# Patient Record
Sex: Female | Born: 2000 | Race: White | Hispanic: Yes | Marital: Single | Smoking: Never smoker
Health system: Southern US, Community
[De-identification: ages and names within clinical notes are randomized; demographics above are authoritative.]

## PROBLEM LIST (undated history)

## (undated) DIAGNOSIS — D509 Iron deficiency anemia, unspecified: Secondary | ICD-10-CM

## (undated) HISTORY — DX: Iron deficiency anemia, unspecified: D50.9

## (undated) HISTORY — PX: NO PAST SURGERIES: SHX2092

---
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
## 2019-01-25 IMAGING — CT CT RENAL STONE PROTOCOL
3 of 4 series · 8 of 46 positions shown, 15 images · non-contrast
Comparison: None.

CLINICAL DATA: Left flank pain for 4 days.  Vaginal bleeding.

EXAM:
CT ABDOMEN AND PELVIS WITHOUT CONTRAST
TECHNIQUE: Multidetector CT imaging of the abdomen and pelvis was performed
following the standard protocol without IV contrast.

[Series 4: lung bases · axial · 0.62mm/px · z∈[-108,-48]mm · 4 of 20 slices shown, 9 images]
[im 4/20  soft-tissue]
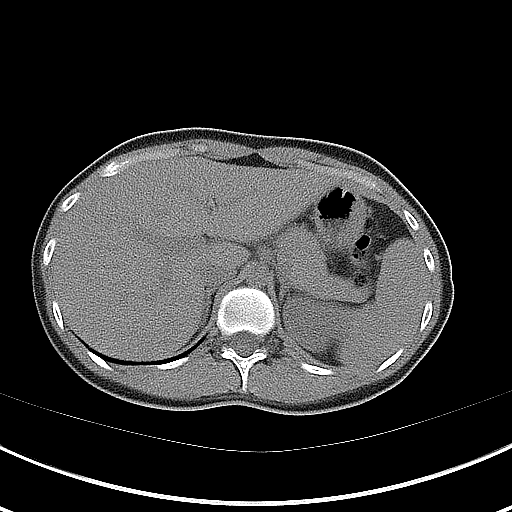
[im 4/20  lung]
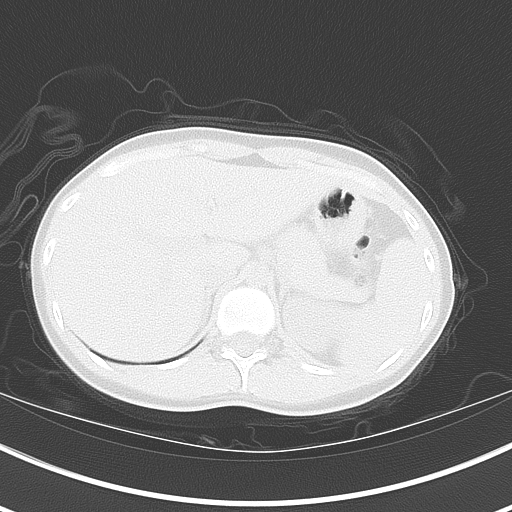
[im 4/20  bone]
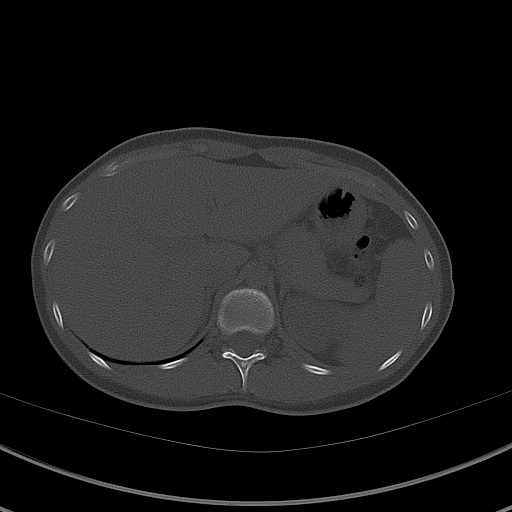
[im 8/20  soft-tissue]
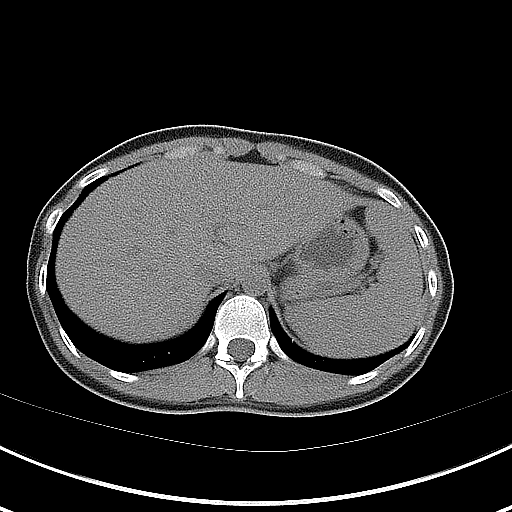
[im 8/20  lung]
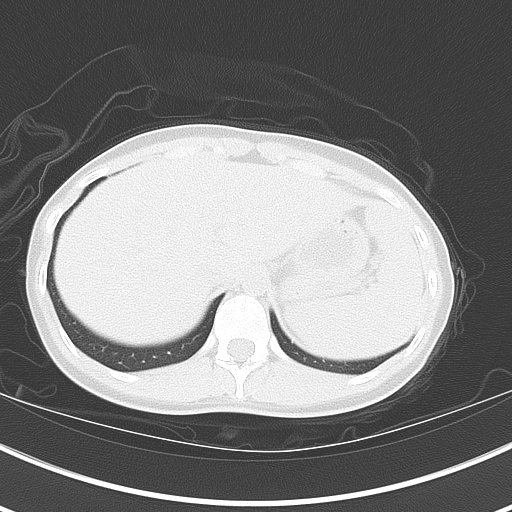
[im 12/20  soft-tissue]
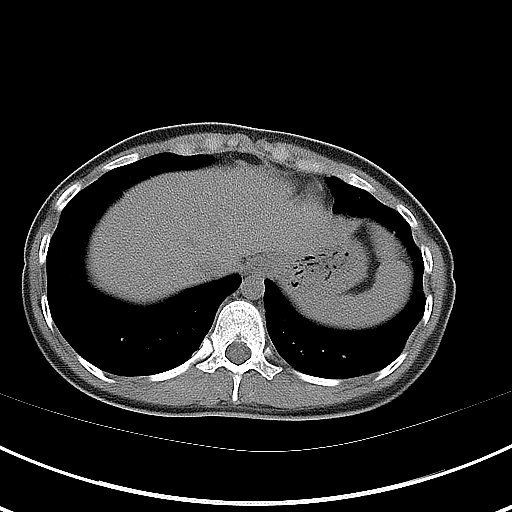
[im 12/20  lung]
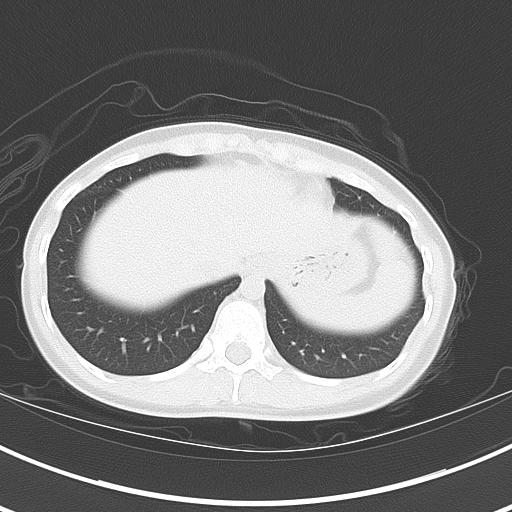
[im 16/20  soft-tissue]
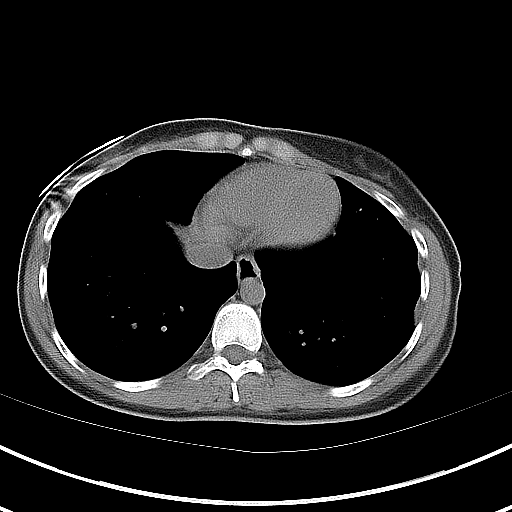
[im 16/20  lung]
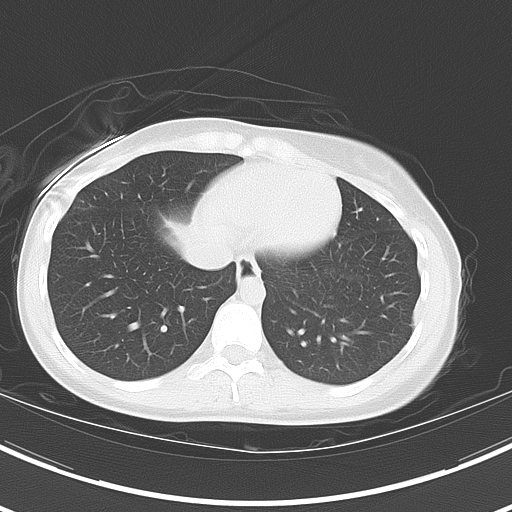

[Series 5: coronal · coronal · 0.62mm/px · 3 of 108 slices shown, 4 images]
[im 36/108  soft-tissue]
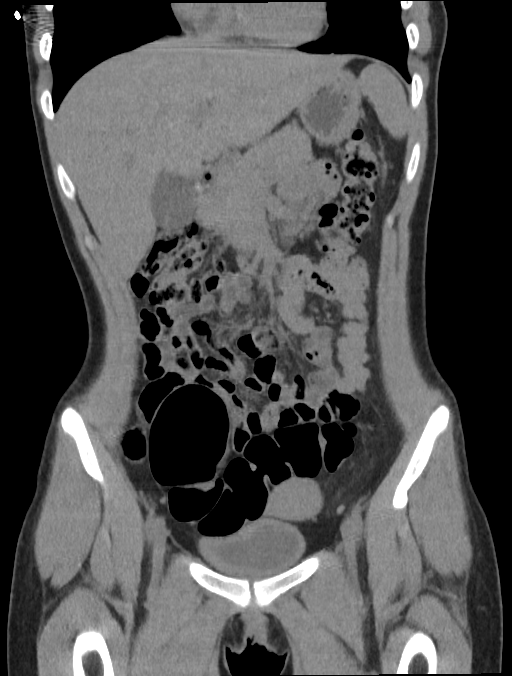
[im 48/108  soft-tissue]
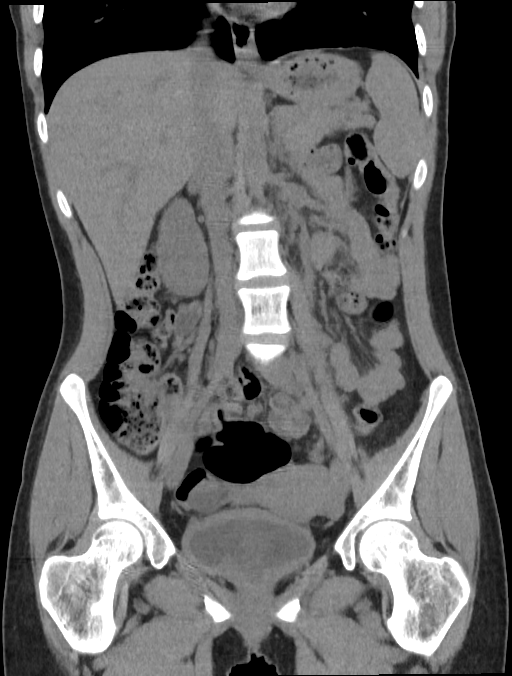
[im 48/108  bone]
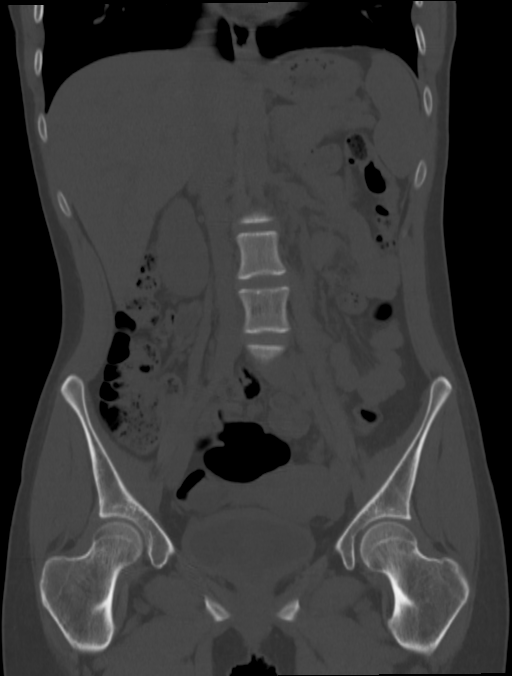
[im 60/108  soft-tissue]
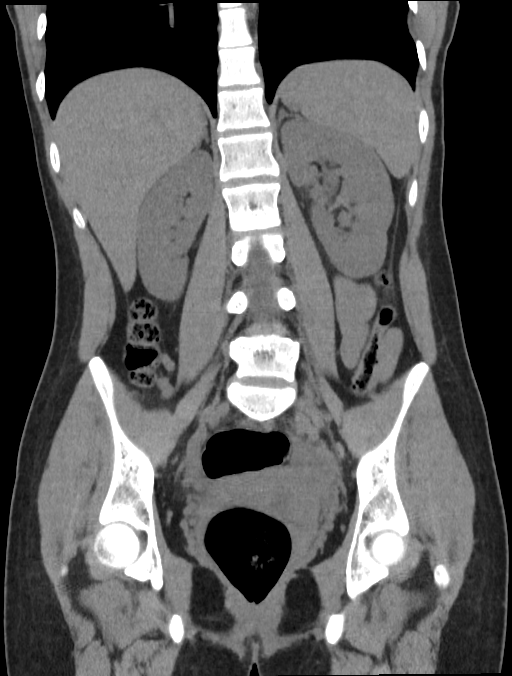

[Series 6: sagittal · sagittal · 0.42mm/px · 1 of 158 slices shown, 2 images]
[im 53/158  soft-tissue]
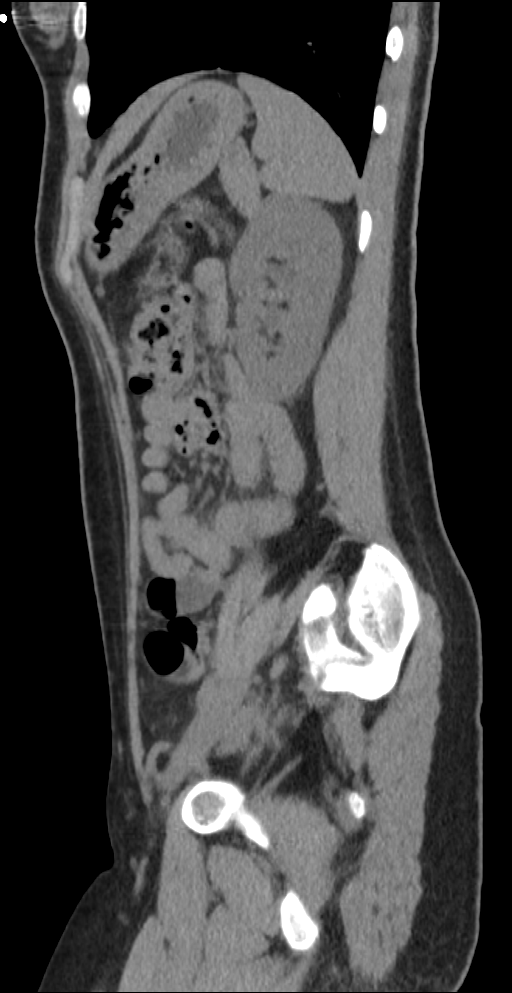
[im 53/158  bone]
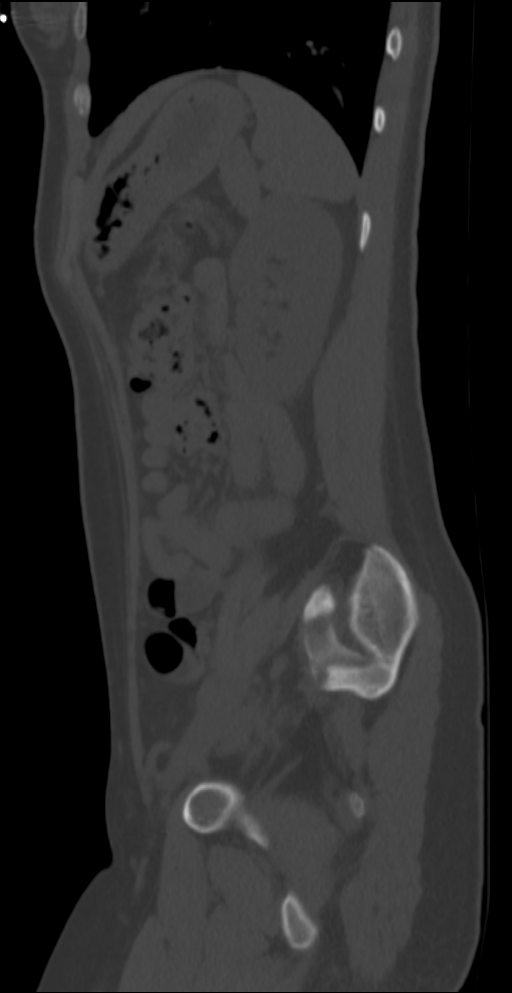

[8 of 46 positions shown; findings below may reference images not displayed]

FINDINGS: Lower chest: Clear lung bases. No significant pleural or pericardial
effusion.

Hepatobiliary: The liver appears unremarkable as imaged in the
noncontrast state. No evidence of gallstones, gallbladder wall
thickening or biliary dilatation.

Pancreas: Unremarkable. No pancreatic ductal dilatation or
surrounding inflammatory changes.

Spleen: Normal in size without focal abnormality.

Adrenals/Urinary Tract: Both adrenal glands appear normal. There is
mild dilatation of the left renal pelvis and left ureter with
minimal asymmetric perinephric soft tissue stranding on the left.
There is no evidence of renal, ureteral or bladder calculus. The
right kidney appears normal. No bladder abnormalities are seen.

Stomach/Bowel: No evidence of bowel wall thickening, distention or
surrounding inflammatory change. Retrocecal appendix appears normal.
There is prominent stool in the rectum.

Vascular/Lymphatic: There are no enlarged abdominal or pelvic lymph
nodes. No significant vascular findings on noncontrast imaging.

Reproductive: The uterus and ovaries appear normal.

Other: No evidence of abdominal wall mass or hernia. No ascites.

Musculoskeletal: No acute or significant osseous findings. Mild
convex left lumbar scoliosis.
IMPRESSION: 1. Mild left-sided hydroureter without evidence of ureteral or
bladder calculus. Findings could be secondary to a recently passed
ureteral calculus. Clinical follow-up recommended.
2. Prominent stool in the rectum without evidence of bowel
obstruction or acute process.
3. The uterus and adnexa appear unremarkable.

## 2019-02-28 ENCOUNTER — Other Ambulatory Visit: Payer: Self-pay

## 2019-02-28 DIAGNOSIS — Z20822 Contact with and (suspected) exposure to covid-19: Secondary | ICD-10-CM

## 2019-03-01 LAB — NOVEL CORONAVIRUS, NAA: SARS-CoV-2, NAA: NOT DETECTED

## 2019-03-29 ENCOUNTER — Encounter: Payer: Self-pay | Admitting: Oncology

## 2019-03-29 ENCOUNTER — Other Ambulatory Visit: Payer: Self-pay

## 2019-03-29 NOTE — Progress Notes (Signed)
Patient stated that she had been doing well with no complaints. 

## 2019-03-30 ENCOUNTER — Inpatient Hospital Stay (HOSPITAL_BASED_OUTPATIENT_CLINIC_OR_DEPARTMENT_OTHER): Payer: Self-pay | Admitting: Oncology

## 2019-03-30 ENCOUNTER — Other Ambulatory Visit: Payer: Self-pay

## 2019-03-30 ENCOUNTER — Encounter: Payer: Self-pay | Admitting: Oncology

## 2019-03-30 ENCOUNTER — Inpatient Hospital Stay: Payer: Self-pay | Attending: Oncology | Admitting: Oncology

## 2019-03-30 VITALS — BP 130/72 | HR 76 | Temp 97.9°F | Resp 16 | Wt 96.4 lb

## 2019-03-30 DIAGNOSIS — D500 Iron deficiency anemia secondary to blood loss (chronic): Secondary | ICD-10-CM

## 2019-03-30 DIAGNOSIS — Z3A00 Weeks of gestation of pregnancy not specified: Secondary | ICD-10-CM | POA: Insufficient documentation

## 2019-03-30 DIAGNOSIS — Z79899 Other long term (current) drug therapy: Secondary | ICD-10-CM | POA: Insufficient documentation

## 2019-03-30 DIAGNOSIS — O99011 Anemia complicating pregnancy, first trimester: Secondary | ICD-10-CM | POA: Insufficient documentation

## 2019-03-30 DIAGNOSIS — D509 Iron deficiency anemia, unspecified: Secondary | ICD-10-CM | POA: Insufficient documentation

## 2019-03-30 DIAGNOSIS — R0602 Shortness of breath: Secondary | ICD-10-CM | POA: Insufficient documentation

## 2019-03-30 DIAGNOSIS — R5383 Other fatigue: Secondary | ICD-10-CM | POA: Insufficient documentation

## 2019-03-30 LAB — CBC WITH DIFFERENTIAL/PLATELET
Abs Immature Granulocytes: 0 10*3/uL (ref 0.00–0.07)
Basophils Absolute: 0 10*3/uL (ref 0.0–0.1)
Basophils Relative: 1 %
Eosinophils Absolute: 0.3 10*3/uL (ref 0.0–0.5)
Eosinophils Relative: 5 %
HCT: 40.3 % (ref 36.0–46.0)
Hemoglobin: 13 g/dL (ref 12.0–15.0)
Immature Granulocytes: 0 %
Lymphocytes Relative: 34 %
Lymphs Abs: 2.1 10*3/uL (ref 0.7–4.0)
MCH: 28.8 pg (ref 26.0–34.0)
MCHC: 32.3 g/dL (ref 30.0–36.0)
MCV: 89.4 fL (ref 80.0–100.0)
Monocytes Absolute: 0.5 10*3/uL (ref 0.1–1.0)
Monocytes Relative: 8 %
Neutro Abs: 3.3 10*3/uL (ref 1.7–7.7)
Neutrophils Relative %: 52 %
Platelets: 221 10*3/uL (ref 150–400)
RBC: 4.51 MIL/uL (ref 3.87–5.11)
RDW: 12.5 % (ref 11.5–15.5)
WBC: 6.2 10*3/uL (ref 4.0–10.5)
nRBC: 0 % (ref 0.0–0.2)

## 2019-03-30 LAB — COMPREHENSIVE METABOLIC PANEL
ALT: 9 U/L (ref 0–44)
AST: 17 U/L (ref 15–41)
Albumin: 4.1 g/dL (ref 3.5–5.0)
Alkaline Phosphatase: 66 U/L (ref 38–126)
Anion gap: 8 (ref 5–15)
BUN: 14 mg/dL (ref 6–20)
CO2: 25 mmol/L (ref 22–32)
Calcium: 9.2 mg/dL (ref 8.9–10.3)
Chloride: 106 mmol/L (ref 98–111)
Creatinine, Ser: 0.65 mg/dL (ref 0.44–1.00)
GFR calc Af Amer: >60 mL/min (ref >60)
GFR calc non Af Amer: >60 mL/min (ref >60)
Glucose, Bld: 92 mg/dL (ref 70–99)
Potassium: 4.9 mmol/L (ref 3.5–5.1)
Sodium: 139 mmol/L (ref 135–145)
Total Bilirubin: 0.5 mg/dL (ref 0.3–1.2)
Total Protein: 7.6 g/dL (ref 6.5–8.1)

## 2019-03-30 LAB — IRON AND TIBC
Iron: 58 ug/dL (ref 28–170)
Saturation Ratios: 15 % (ref 10.4–31.8)
TIBC: 384 ug/dL (ref 250–450)
UIBC: 326 ug/dL

## 2019-03-30 LAB — FERRITIN: Ferritin: 9 ng/mL — ABNORMAL LOW (ref 11–307)

## 2019-03-30 NOTE — Progress Notes (Signed)
Patient was pre-assessed for MD visit. 

## 2019-04-02 MED ORDER — FERROUS SULFATE 325 (65 FE) MG PO TBEC: 325.0000 mg | DELAYED_RELEASE_TABLET | Freq: Two times a day (BID) | ORAL | 3 refills | Status: DC

## 2019-04-02 NOTE — Progress Notes (Signed)
Hematology/Oncology follow-up [facility] [phone] [phone]   Patient Care Team: Ander Slade, MD as PCP - General (Pediatrics)  REFERRING PROVIDER: Ander Slade, MD REASON FOR VISIT Follow up for iron deficiency anemia HISTORY OF PRESENTING ILLNESS:  Madison Nichols is a  18 y.o.  female with PMH listed below who was referred to me for evaluation of anemia. Patient recently had lab work done which revealed anemia with hemoglobin of 8.7 .  Ferritin 5 Reviewed patient's previous labs ordered by primary care physician's office, anemia duration is chronic, dated back to April 2019 at 9.1.  No aggravating or improving factors. She is currently pregnancy with first chid in third trimester and due on July 8th, 2019.  Associated symptoms: Patient reports fatigue. Fatigue: reports worsening fatigue. Chronic onset, perisistent, aggravated  with SOB with exertion, associated with ice chips craving.  Context: Denies weight loss, easy bruising, hematochezia, hemoptysis, hematuria.   Context: History of GI bleeding: Deneis               Menstrual bleeding/ Vaginal bleeding :                Hematemesis or hemoptysis : denies               Blood in urine : denies  Last endoscopy: None.  "Madison Nichols is a 18 y.o. female who has above history reviewed by me today presents for follow up visit for management of iron deficiency anemia. Patient reports doing well at baseline.  Takes ferrous sulfate 325 mg once daily. Denies any fatigue, Madison of breath, chest pain or abdominal pain No new complaints today.   Review of Systems  Constitutional: Negative for chills, fever, malaise/fatigue and weight loss.  HENT: Negative for sore throat.   Eyes: Negative for redness.  Respiratory: Negative for cough, Madison of breath and wheezing.   Cardiovascular: Negative for chest pain, palpitations and leg swelling.   Gastrointestinal: Negative for abdominal pain, blood in stool, nausea and vomiting.  Genitourinary: Negative for dysuria.  Musculoskeletal: Negative for myalgias.  Skin: Negative for rash.  Neurological: Negative for dizziness, tingling and tremors.  Endo/Heme/Allergies: Does not bruise/bleed easily.  Psychiatric/Behavioral: Negative for hallucinations.    MEDICAL HISTORY:  Past Medical History:  Diagnosis Date  . Iron deficiency anemia    No history anemia.  SURGICAL HISTORY: History reviewed. No pertinent surgical history. No past history of surgery  SOCIAL HISTORY: Social History   Socioeconomic History  . Marital status: Single    Spouse name: Not on file  . Number of children: Not on file  . Years of education: Not on file  . Highest education level: Not on file  Occupational History  . Not on file  Social Needs  . Financial resource strain: Not on file  . Food insecurity    Worry: Not on file    Inability: Not on file  . Transportation needs    Medical: Not on file    Non-medical: Not on file  Tobacco Use  . Smoking status: Never Smoker  . Smokeless tobacco: Never Used  Substance and Sexual Activity  . Alcohol use: Not Currently  . Drug use: Never  . Sexual activity: Not Currently  Lifestyle  . Physical activity    Days per week: Not on file    Minutes per session: Not on file  . Stress: Not on file  Relationships  . Social Herbalist on  phone: Not on file    Gets together: Not on file    Attends religious service: Not on file    Active member of club or organization: Not on file    Attends meetings of clubs or organizations: Not on file    Relationship status: Not on file  . Intimate partner violence    Fear of current or ex partner: Not on file    Emotionally abused: Not on file    Physically abused: Not on file    Forced sexual activity: Not on file  Other Topics Concern  . Not on file  Social History Narrative  . Not on file     FAMILY HISTORY: Family History  Family history unknown: Yes    ALLERGIES:  has No Known Allergies.  MEDICATIONS:  Current Outpatient Medications  Medication Sig Dispense Refill  . ferrous sulfate 325 (65 FE) MG EC tablet Take 1 tablet (325 mg total) by mouth 2 (two) times daily. 120 tablet 6  . TRI-LO-MARZIA 0.18/0.215/0.25 MG-25 MCG tab Take 1 tablet by mouth daily.  3  . sulfamethoxazole-trimethoprim (BACTRIM DS,SEPTRA DS) 800-160 MG tablet Take 1 tablet by mouth 2 (two) times daily.     No current facility-administered medications for this visit.      PHYSICAL EXAMINATION: ECOG PERFORMANCE STATUS: 0 - Asymptomatic Vitals:   03/30/19 0944  BP: 130/72  Pulse: 76  Resp: 16  Temp: 97.9 F (36.6 C)  SpO2: 100%   Filed Weights   03/30/19 0944  Weight: 96 lb 6.4 oz (43.7 kg)    Physical Exam Constitutional:      General: She is not in acute distress.    Appearance: She is well-developed.  HENT:     Head: Normocephalic and atraumatic.     Right Ear: External ear normal.     Left Ear: External ear normal.  Eyes:     General: No scleral icterus.    Conjunctiva/sclera: Conjunctivae normal.     Pupils: Pupils are equal, round, and reactive to light.  Neck:     Musculoskeletal: Normal range of motion and neck supple.  Cardiovascular:     Rate and Rhythm: Normal rate and regular rhythm.     Heart sounds: Normal heart sounds.  Pulmonary:     Effort: Pulmonary effort is normal. No respiratory distress.     Breath sounds: Normal breath sounds. No wheezing or rales.  Chest:     Chest wall: No tenderness.  Abdominal:     General: Bowel sounds are normal. There is no distension.     Palpations: Abdomen is soft. There is no mass.     Tenderness: There is no abdominal tenderness.  Musculoskeletal: Normal range of motion.        General: No deformity.  Lymphadenopathy:     Cervical: No cervical adenopathy.  Skin:    General: Skin is warm and dry.     Findings: No  erythema or rash.  Neurological:     Mental Status: She is alert and oriented to person, place, and time.     Cranial Nerves: No cranial nerve deficit.     Coordination: Coordination normal.  Psychiatric:        Behavior: Behavior normal.        Thought Content: Thought content normal.      LABORATORY DATA:  I have reviewed the data as listed Lab Results  Component Value Date   WBC 6.2 03/30/2019   HGB 13.0 03/30/2019   HCT  40.3 03/30/2019   MCV 89.4 03/30/2019   PLT 221 03/30/2019   Recent Labs    04/28/18 1136 03/30/19 0927  NA 137 139  K 4.0 4.9  CL 107 106  CO2 21* 25  GLUCOSE 89 92  BUN 8 14  CREATININE 0.65 0.65  CALCIUM 9.1 9.2  GFRNONAA >60 >60  GFRAA >60 >60  PROT 7.5 7.6  ALBUMIN 4.1 4.1  AST 15 17  ALT 9 9  ALKPHOS 47 66  BILITOT 0.5 0.5   Iron/TIBC/Ferritin/ %Sat    Component Value Date/Time   IRON 58 03/30/2019 0927   TIBC 384 03/30/2019 0927   FERRITIN 9 (L) 03/30/2019 0927   IRONPCTSAT 15 03/30/2019 0927        ASSESSMENT & PLAN:  1. Iron deficiency anemia due to chronic blood loss    Labs are reviewed. Hemoglobin stable at 13. Iron panel was available after clinic. Ferritin decreased to 9, iron saturation 15, TIBC 384. This is consistent with functional iron deficiency. Sent my chart message recommend patient to restart ferrous sulfate 325 mg twice daily. Recommend repeat blood work in 1 year.  Orders Placed This Encounter  Procedures  . CBC with Differential/Platelet    Standing Status:   Future    Standing Expiration Date:   04/01/2020  . Iron and TIBC    Standing Status:   Future    Standing Expiration Date:   04/01/2020  . Ferritin    Standing Status:   Future    Standing Expiration Date:   04/01/2020    All questions were answered. The patient knows to call the clinic with any problems questions or concerns.  Cc PCP Cc Dr. Feliberto GottronSchermerhorn.     Rickard PatienceZhou Earnestine Shipp, MD, PhD Hematology Oncology [facility]  Regional Pager- [phone] 04/02/2019

## 2019-04-02 NOTE — Progress Notes (Signed)
Please arrange patient to repeat blood work in 6 months, follow up with me 1-2 days, MD +/- Venofer.   Dear Madison Nichols,  You iron panel showed decreased ferritin, indicating lowe iron level. I recommend you to start taking oral ferrous sulfate 325mg  twice daily and our team will contact you to schedule you to repeat blood work in 6 months and follow up with me.   Thank you.   Dr.Uzair Godley

## 2019-06-30 ENCOUNTER — Ambulatory Visit: Payer: No Typology Code available for payment source | Admitting: Oncology

## 2019-06-30 ENCOUNTER — Other Ambulatory Visit: Payer: No Typology Code available for payment source

## 2019-08-20 ENCOUNTER — Encounter: Payer: Self-pay | Admitting: Emergency Medicine

## 2019-08-20 ENCOUNTER — Emergency Department
Admission: EM | Admit: 2019-08-20 | Discharge: 2019-08-20 | Disposition: A | Discharge status: Home / Self Care | Payer: Medicaid Other | Attending: Emergency Medicine | Admitting: Emergency Medicine

## 2019-08-20 ENCOUNTER — Other Ambulatory Visit: Payer: Self-pay

## 2019-08-20 DIAGNOSIS — Z79899 Other long term (current) drug therapy: Secondary | ICD-10-CM | POA: Insufficient documentation

## 2019-08-20 DIAGNOSIS — N10 Acute tubulo-interstitial nephritis: Secondary | ICD-10-CM | POA: Insufficient documentation

## 2019-08-20 LAB — CBC
HCT: 39.8 % (ref 36.0–46.0)
Hemoglobin: 13.4 g/dL (ref 12.0–15.0)
MCH: 29.6 pg (ref 26.0–34.0)
MCHC: 33.7 g/dL (ref 30.0–36.0)
MCV: 87.9 fL (ref 80.0–100.0)
Platelets: 240 10*3/uL (ref 150–400)
RBC: 4.53 MIL/uL (ref 3.87–5.11)
RDW: 12.6 % (ref 11.5–15.5)
WBC: 13.3 10*3/uL — ABNORMAL HIGH (ref 4.0–10.5)
nRBC: 0 % (ref 0.0–0.2)

## 2019-08-20 LAB — URINALYSIS, COMPLETE (UACMP) WITH MICROSCOPIC
Bilirubin Urine: NEGATIVE
Glucose, UA: NEGATIVE mg/dL
Hgb urine dipstick: NEGATIVE
Ketones, ur: 5 mg/dL — AB
Nitrite: NEGATIVE
Protein, ur: 30 mg/dL — AB
Specific Gravity, Urine: 1.019 (ref 1.005–1.030)
WBC, UA: >50 WBC/hpf — ABNORMAL HIGH (ref 0–5)
pH: 8 (ref 5.0–8.0)

## 2019-08-20 LAB — COMPREHENSIVE METABOLIC PANEL
ALT: 12 U/L (ref 0–44)
AST: 19 U/L (ref 15–41)
Albumin: 4.8 g/dL (ref 3.5–5.0)
Alkaline Phosphatase: 61 U/L (ref 38–126)
Anion gap: 10 (ref 5–15)
BUN: 10 mg/dL (ref 6–20)
CO2: 23 mmol/L (ref 22–32)
Calcium: 9.1 mg/dL (ref 8.9–10.3)
Chloride: 108 mmol/L (ref 98–111)
Creatinine, Ser: 0.6 mg/dL (ref 0.44–1.00)
GFR calc Af Amer: >60 mL/min (ref >60)
GFR calc non Af Amer: >60 mL/min (ref >60)
Glucose, Bld: 117 mg/dL — ABNORMAL HIGH (ref 70–99)
Potassium: 3.6 mmol/L (ref 3.5–5.1)
Sodium: 141 mmol/L (ref 135–145)
Total Bilirubin: 0.8 mg/dL (ref 0.3–1.2)
Total Protein: 7.9 g/dL (ref 6.5–8.1)

## 2019-08-20 LAB — LIPASE, BLOOD: Lipase: 20 U/L (ref 11–51)

## 2019-08-20 LAB — PREGNANCY, URINE: Preg Test, Ur: NEGATIVE

## 2019-08-20 MED ORDER — LEVOFLOXACIN 250 MG PO TABS: 500.0000 mg | ORAL_TABLET | Freq: Every day | ORAL | 0 refills | Status: AC

## 2019-08-20 MED ORDER — SODIUM CHLORIDE 0.9% FLUSH: 3.0000 mL | Freq: Once | INTRAVENOUS | Status: DC

## 2019-08-20 MED ORDER — SODIUM CHLORIDE 0.9 % IV BOLUS
1000.0000 mL | Freq: Once | INTRAVENOUS | Status: AC
  Administered 2019-08-20: 1000 mL via INTRAVENOUS

## 2019-08-20 MED ORDER — KETOROLAC TROMETHAMINE 30 MG/ML IJ SOLN
15.0000 mg | Freq: Once | INTRAMUSCULAR | Status: AC
  Administered 2019-08-20: 15 mg via INTRAVENOUS
  Filled 2019-08-20: qty 1

## 2019-08-20 MED ORDER — LEVOFLOXACIN IN D5W 500 MG/100ML IV SOLN
500.0000 mg | Freq: Once | INTRAVENOUS | Status: AC
  Administered 2019-08-20: 500 mg via INTRAVENOUS
  Filled 2019-08-20: qty 100

## 2019-08-20 MED ORDER — TRAMADOL HCL 50 MG PO TABS: 50.0000 mg | ORAL_TABLET | Freq: Four times a day (QID) | ORAL | 0 refills | Status: DC | PRN

## 2019-08-20 NOTE — ED Triage Notes (Signed)
Pt to ED via POV stating that she is having lower abdominal pain. Pt state that pain is so bad she can hardly walk. Pt was ambulatory to triage with steady gait and without difficulty or distress. Pt denies N/V/D. Pt denies fever, chills, or urinary symptoms. Pt is in NAD.

## 2019-08-20 NOTE — ED Notes (Signed)
Pt refused wheelchair, pt ambulating to lobby with s/o. Gait steady. Pt denies dizziness or weakness.

## 2019-08-20 NOTE — ED Provider Notes (Signed)
[facility] Emergency Department Provider Note  ____________________________________________  Time seen: Approximately 12:19 PM  I have reviewed the triage vital signs and the nursing notes.   HISTORY  Chief Complaint Abdominal Pain    HPI Madison Nichols is a 19 y.o. female presents to the emergency department for treatment and evaluation of lower abdominal pain that started this morning. She awoke with right side back pain that only hurts when she walks. Lower abdominal pain with urination. She denies vaginal bleeding or discharge.  Past Medical History:  Diagnosis Date  . Iron deficiency anemia     Patient Active Problem List   Diagnosis Date Noted  . Iron deficiency anemia 11/11/2017  . UTI (urinary tract infection) 06/10/2017  . Rubella non-immune status, antepartum 06/09/2017  . Exposure to [place] virus 06/07/2017    History reviewed. No pertinent surgical history.  Prior to Admission medications   Medication Sig Start Date End Date Taking? Authorizing Provider  ferrous sulfate 325 (65 FE) MG EC tablet Take 1 tablet (325 mg total) by mouth 2 (two) times daily. 04/02/19   Earlie Server, MD  levofloxacin (LEVAQUIN) 250 MG tablet Take 2 tablets (500 mg total) by mouth daily for 6 days. 08/21/19 08/27/19  Matther Labell, Johnette Abraham B, FNP  sulfamethoxazole-trimethoprim (BACTRIM DS,SEPTRA DS) 800-160 MG tablet Take 1 tablet by mouth 2 (two) times daily.    [provider]  traMADol (ULTRAM) 50 MG tablet Take 1 tablet (50 mg total) by mouth every 6 (six) hours as needed. 08/20/19   Jazelyn Sipe B, FNP  TRI-LO-MARZIA 0.18/0.215/0.25 MG-25 MCG tab Take 1 tablet by mouth daily. 03/23/18   [provider]    Allergies Patient has no known allergies.  Family History  Family history unknown: Yes    Social History Social History   Tobacco Use  . Smoking status: Never Smoker  . Smokeless tobacco: Never Used  Substance Use Topics  . Alcohol use: Not  Currently  . Drug use: Never    Review of Systems Constitutional: Negative for fever. Respiratory: Negative for shortness of breath or cough. Gastrointestinal: Positive for abdominal pain; negative for nausea , negative for vomiting. Genitourinary: Negative for dysuria , negative for vaginal discharge. Musculoskeletal: Positive for back pain. Skin: Negative for acute skin changes/rash/lesion. ____________________________________________   PHYSICAL EXAM:  VITAL SIGNS: ED Triage Vitals  Enc Vitals Group     BP 08/20/19 1024 109/68     Pulse Rate 08/20/19 1022 (!) 134     Resp 08/20/19 1022 16     Temp 08/20/19 1022 99.5 F (37.5 C)     Temp Source 08/20/19 1022 Oral     SpO2 08/20/19 1022 98 %     Weight 08/20/19 1022 97 lb (44 kg)     Height 08/20/19 1022 4\' 11"  (1.499 m)     Head Circumference --      Peak Flow --      Pain Score 08/20/19 1022 6     Pain Loc --      Pain Edu? --      Excl. in [place]? --     Constitutional: Alert and oriented. Well appearing and in no acute distress. Eyes: Conjunctivae are normal. Head: Atraumatic. Nose: No congestion/rhinnorhea. Mouth/Throat: Mucous membranes are moist. Respiratory: Normal respiratory effort.  No retractions. Gastrointestinal: Bowel sounds active x 4; Abdomen is soft without rebound or guarding. Suprapubic tenderness with palpation. Genitourinary: Pelvic exam: not indicated. Musculoskeletal: No extremity tenderness nor edema. Right side CVA tenderness.  Neurologic:  Normal speech and language. No gross focal neurologic deficits are appreciated. Speech is normal. No gait instability. Skin:  Skin is warm, dry and intact. No rash noted on exposed skin. Psychiatric: Mood and affect are normal. Speech and behavior are normal.  ____________________________________________   LABS (all labs ordered are listed, but only abnormal results are displayed)  Labs Reviewed  COMPREHENSIVE METABOLIC PANEL - Abnormal; Notable for the  following components:      Result Value   Glucose, Bld 117 (*)    All other components within normal limits  CBC - Abnormal; Notable for the following components:   WBC 13.3 (*)    All other components within normal limits  URINALYSIS, COMPLETE (UACMP) WITH MICROSCOPIC - Abnormal; Notable for the following components:   Color, Urine YELLOW (*)    APPearance HAZY (*)    Ketones, ur 5 (*)    Protein, ur 30 (*)    Leukocytes,Ua LARGE (*)    WBC, UA >50 (*)    Bacteria, UA FEW (*)    All other components within normal limits  LIPASE, BLOOD  PREGNANCY, URINE   ____________________________________________  RADIOLOGY  Not indicated. ____________________________________________  Procedures  ____________________________________________  19 year old female presents to the ER for treatment of abdominal pain. While awaiting ER room assignment, protocol labs were drawn. WBC mildly elevated at 13.3 but otherwise normal. Urinalysis shows concern for acute cystitis with large amount of leukocytes, over 50 WBC, and bacteria. On exam, she has suprapubic tenderness and right CVA tenderness. Plan will be to get pregnancy test, give fluids and antibiotics for likely early pyelonephritis then discharge home.  Differential diagnosis includes, but is not limited to: acute cystitis, pyelonephritis, PID, renal colic.  Pregnancy test is negative. Levaquin ordered.   Tachycardia resolved with fluids and rest. Her pain improved as well. She will be discharged home and is aware not to take antibiotic today. She is to follow up with primary care in about a week. ER return precautions also provided.  Pertinent labs & imaging results that were available during my care of the patient were reviewed by me and considered in my medical decision making (see chart for details).  ____________________________________________   FINAL CLINICAL IMPRESSION(S) / ED DIAGNOSES  Final diagnoses:  Pyelonephritis, acute     Note:  This document was prepared using Dragon voice recognition software and may include unintentional dictation errors.   Chinita Pester, FNP 08/20/19 1451    Emily Filbert, MD 08/20/19 [phone]

## 2019-08-20 NOTE — Discharge Instructions (Signed)
Please follow up with your primary care provider next week.  You do not need to take antibiotic today since you had it in your IV. You may take your pain medication as well as tylenol or ibuprofen.  If your symptoms change or get worse and you can't get an appointment with primary care, come back to the ER.

## 2019-12-26 ENCOUNTER — Other Ambulatory Visit: Payer: Self-pay | Admitting: Family Medicine

## 2019-12-26 ENCOUNTER — Other Ambulatory Visit (HOSPITAL_COMMUNITY): Payer: Self-pay | Admitting: Family Medicine

## 2019-12-26 DIAGNOSIS — Z3480 Encounter for supervision of other normal pregnancy, unspecified trimester: Secondary | ICD-10-CM

## 2020-01-03 ENCOUNTER — Other Ambulatory Visit: Payer: Self-pay | Admitting: Family Medicine

## 2020-01-03 DIAGNOSIS — Z3491 Encounter for supervision of normal pregnancy, unspecified, first trimester: Secondary | ICD-10-CM

## 2020-01-03 DIAGNOSIS — Z3480 Encounter for supervision of other normal pregnancy, unspecified trimester: Secondary | ICD-10-CM

## 2020-01-03 DIAGNOSIS — Z3687 Encounter for antenatal screening for uncertain dates: Secondary | ICD-10-CM

## 2020-01-04 ENCOUNTER — Ambulatory Visit
Admission: RE | Admit: 2020-01-04 | Discharge: 2020-01-04 | Disposition: A | Discharge status: Home / Self Care | Payer: Medicaid Other | Source: Ambulatory Visit | Attending: Family Medicine | Admitting: Family Medicine

## 2020-01-04 ENCOUNTER — Other Ambulatory Visit: Payer: Self-pay

## 2020-01-04 DIAGNOSIS — Z3687 Encounter for antenatal screening for uncertain dates: Secondary | ICD-10-CM | POA: Insufficient documentation

## 2020-01-04 DIAGNOSIS — Z3491 Encounter for supervision of normal pregnancy, unspecified, first trimester: Secondary | ICD-10-CM | POA: Insufficient documentation

## 2020-01-04 DIAGNOSIS — Z3480 Encounter for supervision of other normal pregnancy, unspecified trimester: Secondary | ICD-10-CM | POA: Diagnosis not present

## 2020-02-14 LAB — OB RESULTS CONSOLE ABO/RH: RH Type: POSITIVE

## 2020-02-14 LAB — OB RESULTS CONSOLE ANTIBODY SCREEN: Antibody Screen: NEGATIVE

## 2020-02-14 LAB — OB RESULTS CONSOLE HEPATITIS B SURFACE ANTIGEN: Hepatitis B Surface Ag: NEGATIVE

## 2020-02-14 LAB — OB RESULTS CONSOLE VARICELLA ZOSTER ANTIBODY, IGG: Varicella: IMMUNE

## 2020-02-14 LAB — OB RESULTS CONSOLE RUBELLA ANTIBODY, IGM: Rubella: IMMUNE

## 2020-03-27 ENCOUNTER — Other Ambulatory Visit: Payer: Self-pay | Admitting: Family Medicine

## 2020-03-27 DIAGNOSIS — Z3482 Encounter for supervision of other normal pregnancy, second trimester: Secondary | ICD-10-CM

## 2020-04-09 ENCOUNTER — Encounter: Payer: Self-pay | Admitting: *Deleted

## 2020-04-09 ENCOUNTER — Other Ambulatory Visit: Payer: Self-pay

## 2020-04-09 ENCOUNTER — Emergency Department
Admission: EM | Admit: 2020-04-09 | Discharge: 2020-04-09 | Disposition: A | Discharge status: Home / Self Care | Payer: Medicaid Other | Attending: Emergency Medicine | Admitting: Emergency Medicine

## 2020-04-09 DIAGNOSIS — O2342 Unspecified infection of urinary tract in pregnancy, second trimester: Secondary | ICD-10-CM | POA: Insufficient documentation

## 2020-04-09 DIAGNOSIS — O26892 Other specified pregnancy related conditions, second trimester: Secondary | ICD-10-CM | POA: Diagnosis present

## 2020-04-09 DIAGNOSIS — N3000 Acute cystitis without hematuria: Secondary | ICD-10-CM | POA: Insufficient documentation

## 2020-04-09 DIAGNOSIS — Z3A21 21 weeks gestation of pregnancy: Secondary | ICD-10-CM | POA: Diagnosis not present

## 2020-04-09 DIAGNOSIS — O2312 Infections of bladder in pregnancy, second trimester: Secondary | ICD-10-CM

## 2020-04-09 LAB — URINALYSIS, COMPLETE (UACMP) WITH MICROSCOPIC
Bilirubin Urine: NEGATIVE
Glucose, UA: NEGATIVE mg/dL
Ketones, ur: 5 mg/dL — AB
Nitrite: POSITIVE — AB
Protein, ur: 100 mg/dL — AB
Specific Gravity, Urine: 1.018 (ref 1.005–1.030)
Squamous Epithelial / HPF: NONE SEEN (ref 0–5)
WBC, UA: >50 WBC/hpf — ABNORMAL HIGH (ref 0–5)
pH: 6 (ref 5.0–8.0)

## 2020-04-09 MED ORDER — CEPHALEXIN 500 MG PO CAPS: 500.0000 mg | ORAL_CAPSULE | Freq: Three times a day (TID) | ORAL | 0 refills | Status: AC

## 2020-04-09 MED ORDER — CEFTRIAXONE SODIUM 1 G IJ SOLR
1.0000 g | Freq: Once | INTRAMUSCULAR | Status: AC
  Administered 2020-04-09: 1 g via INTRAMUSCULAR
  Filled 2020-04-09: qty 10

## 2020-04-09 MED ORDER — LIDOCAINE HCL (PF) 1 % IJ SOLN
1.2000 mL | Freq: Once | INTRAMUSCULAR | Status: AC
  Administered 2020-04-09: 1.2 mL
  Filled 2020-04-09: qty 5

## 2020-04-09 NOTE — ED Notes (Signed)
follow  up pcp info provided all questions answered , all rx info provided

## 2020-04-09 NOTE — ED Provider Notes (Signed)
[facility] Emergency Department Provider Note ____________________________________________   First MD Initiated Contact with Patient 04/09/20 1650     (approximate)  I have reviewed the triage vital signs and the nursing notes.   HISTORY  Chief Complaint Back Pain  HPI Madison Nichols is a 19 y.o. female [redacted] weeks pregnant presents to the emergency department for treatment and evaluation of left-sided back pain.  Symptoms began this morning.  She has some burning at the end of urination.  She denies nausea, vomiting, diarrhea.  She denies vaginal bleeding or discharge.  She has not taken any medications prior to arrival..         Past Medical History:  Diagnosis Date  . Iron deficiency anemia     Patient Active Problem List   Diagnosis Date Noted  . Iron deficiency anemia 11/11/2017  . UTI (urinary tract infection) 06/10/2017  . Rubella non-immune status, antepartum 06/09/2017  . Exposure to [place] virus 06/07/2017    No past surgical history on file.  Prior to Admission medications   Medication Sig Start Date End Date Taking? Authorizing Provider  cephALEXin (KEFLEX) 500 MG capsule Take 1 capsule (500 mg total) by mouth 3 (three) times daily for 10 days. 04/09/20 04/19/20  Ulani Degrasse, Rulon Eisenmenger B, FNP  ferrous sulfate 325 (65 FE) MG EC tablet Take 1 tablet (325 mg total) by mouth 2 (two) times daily. 04/02/19   Rickard Patience, MD  sulfamethoxazole-trimethoprim (BACTRIM DS,SEPTRA DS) 800-160 MG tablet Take 1 tablet by mouth 2 (two) times daily.    [provider]  traMADol (ULTRAM) 50 MG tablet Take 1 tablet (50 mg total) by mouth every 6 (six) hours as needed. 08/20/19   Dashiel Bergquist B, FNP  TRI-LO-MARZIA 0.18/0.215/0.25 MG-25 MCG tab Take 1 tablet by mouth daily. 03/23/18   [provider]    Allergies Patient has no known allergies.  Family History  Family history unknown: Yes    Social History Social History   Tobacco Use  . Smoking  status: Never Smoker  . Smokeless tobacco: Never Used  Vaping Use  . Vaping Use: Never used  Substance Use Topics  . Alcohol use: Not Currently  . Drug use: Never    Review of Systems  Constitutional: No fever/chills Eyes: No visual changes. ENT: No sore throat. Cardiovascular: Denies chest pain. Respiratory: Denies shortness of breath. Gastrointestinal: No abdominal pain.  No nausea, no vomiting.  No diarrhea.  No constipation. Genitourinary: Negative for dysuria. Musculoskeletal: Positive for back pain. Skin: Negative for rash. Neurological: Negative for headaches, focal weakness or numbness. ____________________________________________   PHYSICAL EXAM:  VITAL SIGNS: ED Triage Vitals  Enc Vitals Group     BP 04/09/20 1559 (!) 105/50     Pulse Rate 04/09/20 1559 90     Resp 04/09/20 1559 18     Temp 04/09/20 1559 99.2 F (37.3 C)     Temp Source 04/09/20 1559 Oral     SpO2 04/09/20 1559 100 %     Weight 04/09/20 1546 99 lb (44.9 kg)     Height 04/09/20 1546 4\' 11"  (1.499 m)     Head Circumference --      Peak Flow --      Pain Score 04/09/20 1546 7     Pain Loc --      Pain Edu? --      Excl. in [place]? --     Constitutional: Alert and oriented. Well appearing and in no acute distress. Eyes:  Conjunctivae are normal. Head: Atraumatic. Nose: No congestion/rhinnorhea. Mouth/Throat: Mucous membranes are moist.  Oropharynx non-erythematous. Neck: No stridor.   Hematological/Lymphatic/Immunilogical: No cervical lymphadenopathy. Cardiovascular: Normal rate, regular rhythm. Grossly normal heart sounds.  Good peripheral circulation. Respiratory: Normal respiratory effort.  No retractions. Lungs CTAB. Gastrointestinal: Soft with mild tenderness overlying the suprapubic area. No distention. No abdominal bruits. No CVA tenderness. Genitourinary:  Musculoskeletal: No lower extremity tenderness nor edema.  No joint effusions. Neurologic:  Normal speech and language. No gross  focal neurologic deficits are appreciated. No gait instability. Skin:  Skin is warm, dry and intact. No rash noted. Psychiatric: Mood and affect are normal. Speech and behavior are normal.  ____________________________________________   LABS (all labs ordered are listed, but only abnormal results are displayed)  Labs Reviewed  URINE CULTURE - Abnormal; Notable for the following components:      Result Value   Culture >=100,000 COLONIES/mL ESCHERICHIA COLI (*)    Organism ID, Bacteria ESCHERICHIA COLI (*)    All other components within normal limits  URINALYSIS, COMPLETE (UACMP) WITH MICROSCOPIC - Abnormal; Notable for the following components:   Color, Urine YELLOW (*)    APPearance CLOUDY (*)    Hgb urine dipstick MODERATE (*)    Ketones, ur 5 (*)    Protein, ur 100 (*)    Nitrite POSITIVE (*)    Leukocytes,Ua LARGE (*)    WBC, UA >50 (*)    Bacteria, UA RARE (*)    All other components within normal limits   ____________________________________________  EKG  Not indicated ____________________________________________  RADIOLOGY  ED MD interpretation:    Not indicated I, Suhas Estis, personally viewed and evaluated these images (plain radiographs) as part of my medical decision making, as well as reviewing the written report by the radiologist.  Official radiology report(s): No results found.  ____________________________________________   PROCEDURES  Procedure(s) performed (including Critical Care):  Procedures  ____________________________________________   INITIAL IMPRESSION / ASSESSMENT AND PLAN     19 year old female presenting to emergency department for treatment and evaluation of flank pain.  See HPI for further details.  Exam is overall reassuring.  While awaiting ER room assignment, urinalysis was collected which is concerning for acute cystitis versus early pyelonephritis.  Patient will be treated with Keflex and advised to follow-up with her  OB/GYN for repeat UA in about 10 days.  If she begins developing any abdominal cramping, vaginal bleeding, or vaginal discharge she is to contact her OB/GYN or return to the emergency department.   ___________________________________________   FINAL CLINICAL IMPRESSION(S) / ED DIAGNOSES  Final diagnoses:  Acute cystitis during pregnancy in second trimester     ED Discharge Orders         Ordered    cephALEXin (KEFLEX) 500 MG capsule  3 times daily        04/09/20 1712           Ludene Stokke was evaluated in Emergency Department on 04/14/2020 for the symptoms described in the history of present illness. She was evaluated in the context of the global COVID-19 pandemic, which necessitated consideration that the patient might be at risk for infection with the SARS-CoV-2 virus that causes COVID-19. Institutional protocols and algorithms that pertain to the evaluation of patients at risk for COVID-19 are in a state of rapid change based on information released by regulatory bodies including the CDC and federal and state organizations. These policies and algorithms were followed during the patient's care in the ED.  Note:  This document was prepared using Dragon voice recognition software and may include unintentional dictation errors.    Chinita Pester, FNP 04/14/20 1147    Shaune Pollack, MD 04/15/20 [phone]

## 2020-04-09 NOTE — ED Notes (Signed)
Room computer was malfunctioning and had to document meds seperately.

## 2020-04-09 NOTE — ED Triage Notes (Addendum)
Pt states she has left side back pain.  Sx began this am.  No urinary sx.  No n/v/d  No abd pain.  No vag bleeding.   Pt is [redacted] weeks pregnant  Pt alert.  No otc meds for back pain.

## 2020-04-12 ENCOUNTER — Other Ambulatory Visit: Payer: Self-pay

## 2020-04-12 ENCOUNTER — Ambulatory Visit
Admission: RE | Admit: 2020-04-12 | Discharge: 2020-04-12 | Disposition: A | Discharge status: Home / Self Care | Payer: Medicaid Other | Source: Ambulatory Visit | Attending: Family Medicine | Admitting: Family Medicine

## 2020-04-12 DIAGNOSIS — Z3482 Encounter for supervision of other normal pregnancy, second trimester: Secondary | ICD-10-CM | POA: Diagnosis not present

## 2020-04-12 LAB — URINE CULTURE: Culture: >=100000 — AB

## 2020-05-29 LAB — OB RESULTS CONSOLE HIV ANTIBODY (ROUTINE TESTING): HIV: NONREACTIVE

## 2020-05-31 LAB — OB RESULTS CONSOLE RPR: RPR: NONREACTIVE

## 2020-06-01 NOTE — L&D Delivery Note (Addendum)
Delivery Note  Date of delivery: 08/11/2020 Estimated Date of Delivery: 08/15/20 Patient's last menstrual period was 10/11/2019 (approximate). EGA: [redacted]w[redacted]d  Delivery Note At 3:21 PM a viable female was delivered via Vaginal, Spontaneous (Presentation: Left Occiput Anterior).  APGAR: 9, 9; weight pending.  Placenta status: Spontaneous, Intact.  Cord: 3 vessels with the following complications: None.    First Stage: Labor onset: 1000 Augmentation : None Analgesia /Anesthesia intrapartum: None AROM at 1510  Madison Nichols presented to L&D with active labor.  Second Stage: Complete dilation at 1512 Onset of pushing at 1512 FHR second stage Cat II Delivery at 1521 on 08/11/2020  She progressed to complete and had a spontaneous vaginal birth of a live female over an intact perineum. The fetal head was delivered in OA position with restitution to LOA. Loose nuchal cord easily reduced. Anterior then posterior shoulders delivered spontaneously. Baby placed on mom's abdomen and attended to by transition RN. Cord clamped and cut when pulseless by FOB. Cord blood obtained for newborn labs.  Third Stage: Placenta delivered intact with 3VC at 1526 Placenta disposition: routine disposal Uterine tone firm / bleeding min IV pitocin given for hemorrhage prophylaxis  Anesthesia: None Episiotomy: None Lacerations: Labial Suture Repair: 3.0 vicryl Est. Blood Loss (mL):  150  Complications: none  Mom to postpartum.  Baby to Couplet care / Skin to Skin.  Newborn: Birth Weight: pending  Apgar Scores: 9, 9 Feeding planned: bottlefeeding    [name], CNM 08/11/2020 3:38 PM

## 2020-07-12 ENCOUNTER — Other Ambulatory Visit: Payer: Self-pay

## 2020-07-12 DIAGNOSIS — D500 Iron deficiency anemia secondary to blood loss (chronic): Secondary | ICD-10-CM

## 2020-07-24 LAB — OB RESULTS CONSOLE GBS: GBS: NEGATIVE

## 2020-08-01 LAB — OB RESULTS CONSOLE GC/CHLAMYDIA
Chlamydia: NEGATIVE
Gonorrhea: NEGATIVE

## 2020-08-05 ENCOUNTER — Inpatient Hospital Stay: Payer: Medicaid Other | Attending: Oncology

## 2020-08-05 DIAGNOSIS — Z3A38 38 weeks gestation of pregnancy: Secondary | ICD-10-CM | POA: Diagnosis not present

## 2020-08-05 DIAGNOSIS — O26893 Other specified pregnancy related conditions, third trimester: Secondary | ICD-10-CM | POA: Insufficient documentation

## 2020-08-05 DIAGNOSIS — Z79899 Other long term (current) drug therapy: Secondary | ICD-10-CM | POA: Insufficient documentation

## 2020-08-05 DIAGNOSIS — D500 Iron deficiency anemia secondary to blood loss (chronic): Secondary | ICD-10-CM

## 2020-08-05 DIAGNOSIS — D508 Other iron deficiency anemias: Secondary | ICD-10-CM | POA: Insufficient documentation

## 2020-08-05 DIAGNOSIS — R5383 Other fatigue: Secondary | ICD-10-CM | POA: Diagnosis not present

## 2020-08-05 DIAGNOSIS — R0602 Shortness of breath: Secondary | ICD-10-CM | POA: Diagnosis not present

## 2020-08-05 LAB — FERRITIN: Ferritin: 8 ng/mL — ABNORMAL LOW (ref 11–307)

## 2020-08-05 LAB — COMPREHENSIVE METABOLIC PANEL
ALT: 5 U/L (ref 0–44)
AST: 17 U/L (ref 15–41)
Albumin: 2.9 g/dL — ABNORMAL LOW (ref 3.5–5.0)
Alkaline Phosphatase: 140 U/L — ABNORMAL HIGH (ref 38–126)
Anion gap: 11 (ref 5–15)
BUN: 10 mg/dL (ref 6–20)
CO2: 20 mmol/L — ABNORMAL LOW (ref 22–32)
Calcium: 8.4 mg/dL — ABNORMAL LOW (ref 8.9–10.3)
Chloride: 105 mmol/L (ref 98–111)
Creatinine, Ser: 0.58 mg/dL (ref 0.44–1.00)
GFR, Estimated: >60 mL/min (ref >60)
Glucose, Bld: 139 mg/dL — ABNORMAL HIGH (ref 70–99)
Potassium: 4 mmol/L (ref 3.5–5.1)
Sodium: 136 mmol/L (ref 135–145)
Total Bilirubin: 0.5 mg/dL (ref 0.3–1.2)
Total Protein: 6.5 g/dL (ref 6.5–8.1)

## 2020-08-05 LAB — CBC WITH DIFFERENTIAL/PLATELET
Abs Immature Granulocytes: 0.02 10*3/uL (ref 0.00–0.07)
Basophils Absolute: 0 10*3/uL (ref 0.0–0.1)
Basophils Relative: 0 %
Eosinophils Absolute: 0.1 10*3/uL (ref 0.0–0.5)
Eosinophils Relative: 1 %
HCT: 26.6 % — ABNORMAL LOW (ref 36.0–46.0)
Hemoglobin: 7.8 g/dL — ABNORMAL LOW (ref 12.0–15.0)
Immature Granulocytes: 0 %
Lymphocytes Relative: 28 %
Lymphs Abs: 1.6 10*3/uL (ref 0.7–4.0)
MCH: 22.5 pg — ABNORMAL LOW (ref 26.0–34.0)
MCHC: 29.3 g/dL — ABNORMAL LOW (ref 30.0–36.0)
MCV: 76.9 fL — ABNORMAL LOW (ref 80.0–100.0)
Monocytes Absolute: 0.2 10*3/uL (ref 0.1–1.0)
Monocytes Relative: 4 %
Neutro Abs: 3.8 10*3/uL (ref 1.7–7.7)
Neutrophils Relative %: 67 %
Platelets: 212 10*3/uL (ref 150–400)
RBC: 3.46 MIL/uL — ABNORMAL LOW (ref 3.87–5.11)
RDW: 18.3 % — ABNORMAL HIGH (ref 11.5–15.5)
WBC: 5.8 10*3/uL (ref 4.0–10.5)
nRBC: 0 % (ref 0.0–0.2)

## 2020-08-05 LAB — IRON AND TIBC
Iron: 25 ug/dL — ABNORMAL LOW (ref 28–170)
Saturation Ratios: 4 % — ABNORMAL LOW (ref 10.4–31.8)
TIBC: 606 ug/dL — ABNORMAL HIGH (ref 250–450)
UIBC: 581 ug/dL

## 2020-08-06 ENCOUNTER — Encounter: Payer: Self-pay | Admitting: Oncology

## 2020-08-06 ENCOUNTER — Other Ambulatory Visit: Payer: Self-pay

## 2020-08-06 ENCOUNTER — Inpatient Hospital Stay: Payer: Medicaid Other

## 2020-08-06 ENCOUNTER — Inpatient Hospital Stay (HOSPITAL_BASED_OUTPATIENT_CLINIC_OR_DEPARTMENT_OTHER): Payer: Medicaid Other | Admitting: Oncology

## 2020-08-06 VITALS — BP 111/66 | HR 93 | Resp 18

## 2020-08-06 VITALS — BP 116/61 | HR 96 | Temp 97.9°F | Resp 18 | Wt 120.5 lb

## 2020-08-06 DIAGNOSIS — Z3493 Encounter for supervision of normal pregnancy, unspecified, third trimester: Secondary | ICD-10-CM

## 2020-08-06 DIAGNOSIS — D508 Other iron deficiency anemias: Secondary | ICD-10-CM

## 2020-08-06 MED ORDER — IRON SUCROSE 20 MG/ML IV SOLN
200.0000 mg | Freq: Once | INTRAVENOUS | Status: AC
  Administered 2020-08-06: 200 mg via INTRAVENOUS
  Filled 2020-08-06: qty 10

## 2020-08-06 MED ORDER — SODIUM CHLORIDE 0.9 % IV SOLN
Freq: Once | INTRAVENOUS | Status: AC
  Filled 2020-08-06: qty 250

## 2020-08-06 NOTE — Progress Notes (Signed)
Patient here for follow up. Pt is [redacted] weeks pregnant.

## 2020-08-06 NOTE — Progress Notes (Signed)
Hematology/Oncology follow-up [facility] [phone] [phone]   Patient Care Team: Center, [facility] as PCP - General (General Practice) Rickard Patience, MD as Consulting Physician (Hematology and Oncology)  REFERRING PROVIDER: Center, [facility] REASON FOR VISIT Follow up for iron deficiency anemia HISTORY OF PRESENTING ILLNESS:  Madison Nichols is a  20 y.o.  female with PMH listed below who was referred to me for evaluation of anemia. Patient recently had lab work done which revealed anemia with hemoglobin of 8.7 .  Ferritin 5 Reviewed patient's previous labs ordered by primary care physician's office, anemia duration is chronic, dated back to April 2019 at 9.1.  No aggravating or improving factors. She is currently pregnancy with first chid in third trimester and due on July 8th, 2019.  Associated symptoms: Patient reports fatigue. Fatigue: reports worsening fatigue. Chronic onset, perisistent, aggravated  with SOB with exertion, associated with ice chips craving.  Context: Denies weight loss, easy bruising, hematochezia, hemoptysis, hematuria.   Context: History of GI bleeding: Deneis               Menstrual bleeding/ Vaginal bleeding :                Hematemesis or hemoptysis : denies               Blood in urine : denies  Last endoscopy: None.  "Shortness of INTERVAL HISTORY Madison Nichols is a 20 y.o. female who has above history reviewed by me today presents for follow up visit for management of iron deficiency anemia. Patient reports doing well at baseline.  Patient was last seen by me 03/30/2019.  Lost follow-up afterwards. Recently being referred back to see [place] for iron deficiency anemia. Currently she is pregnant with her second child, 38 weeks.  Due date is August 15, 2020 Patient reports her current pregnancy course is uncomplicated. She feels tired She previously tolerated Venofer treatments  very well.  Review of Systems  Constitutional: Positive for malaise/fatigue. Negative for chills, fever and weight loss.  HENT: Negative for sore throat.   Eyes: Negative for redness.  Respiratory: Negative for cough, shortness of breath and wheezing.   Cardiovascular: Negative for chest pain, palpitations and leg swelling.  Gastrointestinal: Negative for abdominal pain, blood in stool, nausea and vomiting.  Genitourinary: Negative for dysuria.  Musculoskeletal: Negative for myalgias.  Skin: Negative for rash.  Neurological: Negative for dizziness, tingling and tremors.  Endo/Heme/Allergies: Does not bruise/bleed easily.  Psychiatric/Behavioral: Negative for hallucinations.    MEDICAL HISTORY:  Past Medical History:  Diagnosis Date  . Iron deficiency anemia    No history anemia.  SURGICAL HISTORY: No past surgical history on file. No past history of surgery  SOCIAL HISTORY: Social History   Socioeconomic History  . Marital status: Single    Spouse name: Not on file  . Number of children: Not on file  . Years of education: Not on file  . Highest education level: Not on file  Occupational History  . Not on file  Tobacco Use  . Smoking status: Never Smoker  . Smokeless tobacco: Never Used  Vaping Use  . Vaping Use: Never used  Substance and Sexual Activity  . Alcohol use: Not Currently  . Drug use: Never  . Sexual activity: Not Currently  Other Topics Concern  . Not on file  Social History Narrative  . Not on file   Social Determinants of Health   Financial Resource Strain:  Not on file  Food Insecurity: Not on file  Transportation Needs: Not on file  Physical Activity: Not on file  Stress: Not on file  Social Connections: Not on file  Intimate Partner Violence: Not on file    FAMILY HISTORY: Family History  Family history unknown: Yes    ALLERGIES:  has No Known Allergies.  MEDICATIONS:  Current Outpatient Medications  Medication Sig Dispense  Refill  . ferrous sulfate 325 (65 FE) MG EC tablet Take 1 tablet (325 mg total) by mouth 2 (two) times daily. 120 tablet 3  . sulfamethoxazole-trimethoprim (BACTRIM DS,SEPTRA DS) 800-160 MG tablet Take 1 tablet by mouth 2 (two) times daily.    . traMADol (ULTRAM) 50 MG tablet Take 1 tablet (50 mg total) by mouth every 6 (six) hours as needed. 12 tablet 0  . famotidine (PEPCID) 20 MG tablet TAKE 1 TABLET BY MOUTH TWICE A DAY AS NEEDED FOR HEARTBURN    . TRI-LO-MARZIA 0.18/0.215/0.25 MG-25 MCG tab Take 1 tablet by mouth daily. (Patient not taking: Reported on 08/06/2020)  3   No current facility-administered medications for this visit.     PHYSICAL EXAMINATION: ECOG PERFORMANCE STATUS: 1 - Symptomatic but completely ambulatory Vitals:   08/06/20 1354  BP: 116/61  Pulse: 96  Resp: 18  Temp: 97.9 F (36.6 C)   Filed Weights   08/06/20 1354  Weight: 120 lb 8 oz (54.7 kg)    Physical Exam Constitutional:      General: She is not in acute distress.    Appearance: She is well-developed.  HENT:     Head: Normocephalic and atraumatic.     Right Ear: External ear normal.     Left Ear: External ear normal.  Eyes:     General: No scleral icterus.    Conjunctiva/sclera: Conjunctivae normal.     Pupils: Pupils are equal, round, and reactive to light.  Cardiovascular:     Rate and Rhythm: Normal rate and regular rhythm.     Heart sounds: Normal heart sounds.  Pulmonary:     Effort: Pulmonary effort is normal. No respiratory distress.     Breath sounds: Normal breath sounds. No wheezing or rales.  Chest:     Chest wall: No tenderness.  Abdominal:     General: Bowel sounds are normal. There is no distension.     Palpations: Abdomen is soft. There is no mass.     Tenderness: There is no abdominal tenderness.  Musculoskeletal:        General: No deformity. Normal range of motion.     Cervical back: Normal range of motion and neck supple.  Lymphadenopathy:     Cervical: No cervical  adenopathy.  Skin:    General: Skin is warm and dry.     Findings: No erythema or rash.  Neurological:     Mental Status: She is alert and oriented to person, place, and time.     Cranial Nerves: No cranial nerve deficit.     Coordination: Coordination normal.  Psychiatric:        Behavior: Behavior normal.        Thought Content: Thought content normal.      LABORATORY DATA:  I have reviewed the data as listed Lab Results  Component Value Date   WBC 5.8 08/05/2020   HGB 7.8 (L) 08/05/2020   HCT 26.6 (L) 08/05/2020   MCV 76.9 (L) 08/05/2020   PLT 212 08/05/2020   Recent Labs    08/20/19 1035 08/05/20  1309  NA 141 136  K 3.6 4.0  CL 108 105  CO2 23 20*  GLUCOSE 117* 139*  BUN 10 10  CREATININE 0.60 0.58  CALCIUM 9.1 8.4*  GFRNONAA >60 >60  GFRAA >60  --   PROT 7.9 6.5  ALBUMIN 4.8 2.9*  AST 19 17  ALT 12 5  ALKPHOS 61 140*  BILITOT 0.8 0.5   Iron/TIBC/Ferritin/ %Sat    Component Value Date/Time   IRON 25 (L) 08/05/2020 1309   TIBC 606 (H) 08/05/2020 1309   FERRITIN 8 (L) 08/05/2020 1309   IRONPCTSAT 4 (L) 08/05/2020 1309        ASSESSMENT & PLAN:  1. Other iron deficiency anemia   2. Third trimester pregnancy    #Iron deficiency anemia during pregnancy Labs reviewed and discussed with patient. Hemoglobin is decreased to 7.8, MCV 76.9, Severely decreased ferritin of 8, iron saturation at 4, TIBC increased .  Consistent with severe iron deficiency. Patient has tolerated IV Venofer treatments previously. Proceed with Venofer treatment 200 mg today. Repeat another dose later this week or early next week. Her due date is coming up. I will see her in 4 weeks with repeat blood work and reevaluation of need of additional IV iron treatments.    Orders Placed This Encounter  Procedures  . CBC with Differential/Platelet    Standing Status:   Future    Standing Expiration Date:   08/06/2021  . Ferritin    Standing Status:   Future    Standing  Expiration Date:   08/06/2021  . Iron and TIBC    Standing Status:   Future    Standing Expiration Date:   08/06/2021    All questions were answered. The patient knows to call the clinic with any problems questions or concerns.  Cc PCP Cc Dr. Feliberto Gottron.     Rickard Patience, MD, PhD Hematology Oncology [facility] [place] at [facility] Pager- [phone] 08/06/2020

## 2020-08-11 ENCOUNTER — Other Ambulatory Visit: Payer: Self-pay

## 2020-08-11 ENCOUNTER — Inpatient Hospital Stay
Admission: EM | Admit: 2020-08-11 | Discharge: 2020-08-12 | DRG: 806 | Disposition: A | Discharge status: Home / Self Care | Payer: Medicaid Other | Attending: Obstetrics and Gynecology | Admitting: Obstetrics and Gynecology

## 2020-08-11 DIAGNOSIS — Z20822 Contact with and (suspected) exposure to covid-19: Secondary | ICD-10-CM | POA: Diagnosis present

## 2020-08-11 DIAGNOSIS — Z3A39 39 weeks gestation of pregnancy: Secondary | ICD-10-CM | POA: Diagnosis not present

## 2020-08-11 DIAGNOSIS — O26893 Other specified pregnancy related conditions, third trimester: Secondary | ICD-10-CM | POA: Diagnosis present

## 2020-08-11 DIAGNOSIS — O9081 Anemia of the puerperium: Secondary | ICD-10-CM | POA: Diagnosis not present

## 2020-08-11 DIAGNOSIS — D62 Acute posthemorrhagic anemia: Secondary | ICD-10-CM | POA: Diagnosis not present

## 2020-08-11 DIAGNOSIS — O9962 Diseases of the digestive system complicating childbirth: Secondary | ICD-10-CM | POA: Diagnosis present

## 2020-08-11 DIAGNOSIS — K219 Gastro-esophageal reflux disease without esophagitis: Secondary | ICD-10-CM | POA: Diagnosis present

## 2020-08-11 LAB — CBC
HCT: 29.5 % — ABNORMAL LOW (ref 36.0–46.0)
Hemoglobin: 8.9 g/dL — ABNORMAL LOW (ref 12.0–15.0)
MCH: 23.5 pg — ABNORMAL LOW (ref 26.0–34.0)
MCHC: 30.2 g/dL (ref 30.0–36.0)
MCV: 77.8 fL — ABNORMAL LOW (ref 80.0–100.0)
Platelets: 214 10*3/uL (ref 150–400)
RBC: 3.79 MIL/uL — ABNORMAL LOW (ref 3.87–5.11)
RDW: 20.7 % — ABNORMAL HIGH (ref 11.5–15.5)
WBC: 7 10*3/uL (ref 4.0–10.5)
nRBC: 0 % (ref 0.0–0.2)

## 2020-08-11 LAB — TYPE AND SCREEN
ABO/RH(D): O POS
Antibody Screen: NEGATIVE

## 2020-08-11 LAB — RESP PANEL BY RT-PCR (FLU A&B, COVID) ARPGX2
Influenza A by PCR: NEGATIVE
Influenza B by PCR: NEGATIVE
SARS Coronavirus 2 by RT PCR: NEGATIVE

## 2020-08-11 MED ORDER — ACETAMINOPHEN 325 MG PO TABS: 650.0000 mg | ORAL_TABLET | ORAL | Status: DC | PRN

## 2020-08-11 MED ORDER — OXYTOCIN 10 UNIT/ML IJ SOLN
INTRAMUSCULAR | Status: AC
  Filled 2020-08-11: qty 1

## 2020-08-11 MED ORDER — FERROUS SULFATE 325 (65 FE) MG PO TABS
325.0000 mg | ORAL_TABLET | Freq: Two times a day (BID) | ORAL | Status: DC
  Administered 2020-08-12: 325 mg via ORAL
  Filled 2020-08-11: qty 1

## 2020-08-11 MED ORDER — LACTATED RINGERS IV SOLN: 500.0000 mL | INTRAVENOUS | Status: DC | PRN

## 2020-08-11 MED ORDER — AMMONIA AROMATIC IN INHA
RESPIRATORY_TRACT | Status: AC
  Filled 2020-08-11: qty 10

## 2020-08-11 MED ORDER — DOCUSATE SODIUM 100 MG PO CAPS
100.0000 mg | ORAL_CAPSULE | Freq: Two times a day (BID) | ORAL | Status: DC
  Filled 2020-08-11: qty 1

## 2020-08-11 MED ORDER — SOD CITRATE-CITRIC ACID 500-334 MG/5ML PO SOLN: 30.0000 mL | ORAL | Status: DC | PRN

## 2020-08-11 MED ORDER — OXYCODONE HCL 5 MG PO TABS: 10.0000 mg | ORAL_TABLET | ORAL | Status: DC | PRN

## 2020-08-11 MED ORDER — DIBUCAINE (PERIANAL) 1 % EX OINT: 1.0000 "application " | TOPICAL_OINTMENT | CUTANEOUS | Status: DC | PRN

## 2020-08-11 MED ORDER — OXYCODONE-ACETAMINOPHEN 5-325 MG PO TABS: 1.0000 | ORAL_TABLET | ORAL | Status: DC | PRN

## 2020-08-11 MED ORDER — MISOPROSTOL 200 MCG PO TABS
ORAL_TABLET | ORAL | Status: AC
  Filled 2020-08-11: qty 4

## 2020-08-11 MED ORDER — OXYTOCIN-SODIUM CHLORIDE 30-0.9 UT/500ML-% IV SOLN
INTRAVENOUS | Status: AC
  Filled 2020-08-11: qty 1000

## 2020-08-11 MED ORDER — IBUPROFEN 600 MG PO TABS: 600.0000 mg | ORAL_TABLET | Freq: Four times a day (QID) | ORAL | Status: DC | PRN

## 2020-08-11 MED ORDER — COCONUT OIL OIL: 1.0000 "application " | TOPICAL_OIL | Status: DC | PRN

## 2020-08-11 MED ORDER — OXYCODONE HCL 5 MG PO TABS
5.0000 mg | ORAL_TABLET | ORAL | Status: DC | PRN
Start: 2020-08-11 — End: 2020-08-12

## 2020-08-11 MED ORDER — BENZOCAINE-MENTHOL 20-0.5 % EX AERO
1.0000 "application " | INHALATION_SPRAY | CUTANEOUS | Status: DC | PRN
  Filled 2020-08-11: qty 56

## 2020-08-11 MED ORDER — ONDANSETRON HCL 4 MG/2ML IJ SOLN: 4.0000 mg | INTRAMUSCULAR | Status: DC | PRN

## 2020-08-11 MED ORDER — PRENATAL MULTIVITAMIN CH
1.0000 | ORAL_TABLET | Freq: Every day | ORAL | Status: DC
  Administered 2020-08-12: 1 via ORAL
  Filled 2020-08-11: qty 1

## 2020-08-11 MED ORDER — LIDOCAINE HCL (PF) 1 % IJ SOLN
INTRAMUSCULAR | Status: AC
  Filled 2020-08-11: qty 30

## 2020-08-11 MED ORDER — DIPHENHYDRAMINE HCL 25 MG PO CAPS: 25.0000 mg | ORAL_CAPSULE | Freq: Four times a day (QID) | ORAL | Status: DC | PRN

## 2020-08-11 MED ORDER — WITCH HAZEL-GLYCERIN EX PADS
1.0000 "application " | MEDICATED_PAD | CUTANEOUS | Status: DC | PRN
  Filled 2020-08-11: qty 100

## 2020-08-11 MED ORDER — TETANUS-DIPHTH-ACELL PERTUSSIS 5-2.5-18.5 LF-MCG/0.5 IM SUSY: 0.5000 mL | PREFILLED_SYRINGE | Freq: Once | INTRAMUSCULAR | Status: DC

## 2020-08-11 MED ORDER — SIMETHICONE 80 MG PO CHEW: 80.0000 mg | CHEWABLE_TABLET | ORAL | Status: DC | PRN

## 2020-08-11 MED ORDER — OXYTOCIN-SODIUM CHLORIDE 30-0.9 UT/500ML-% IV SOLN
2.5000 [IU]/h | INTRAVENOUS | Status: DC
  Administered 2020-08-11: 2.5 [IU]/h via INTRAVENOUS

## 2020-08-11 MED ORDER — LIDOCAINE HCL (PF) 1 % IJ SOLN
30.0000 mL | INTRAMUSCULAR | Status: AC | PRN
  Administered 2020-08-11: 30 mL via SUBCUTANEOUS

## 2020-08-11 MED ORDER — IBUPROFEN 600 MG PO TABS
600.0000 mg | ORAL_TABLET | Freq: Four times a day (QID) | ORAL | Status: DC
  Administered 2020-08-11 – 2020-08-12 (×2): 600 mg via ORAL
  Filled 2020-08-11 (×3): qty 1

## 2020-08-11 MED ORDER — OXYCODONE-ACETAMINOPHEN 5-325 MG PO TABS: 2.0000 | ORAL_TABLET | ORAL | Status: DC | PRN

## 2020-08-11 MED ORDER — LACTATED RINGERS IV SOLN: INTRAVENOUS | Status: DC

## 2020-08-11 MED ORDER — OXYTOCIN BOLUS FROM INFUSION
333.0000 mL | Freq: Once | INTRAVENOUS | Status: AC
  Administered 2020-08-11: 333 mL via INTRAVENOUS

## 2020-08-11 MED ORDER — IBUPROFEN 600 MG PO TABS
ORAL_TABLET | ORAL | Status: AC
  Administered 2020-08-11: 600 mg via ORAL
  Filled 2020-08-11: qty 1

## 2020-08-11 MED ORDER — ONDANSETRON HCL 4 MG/2ML IJ SOLN: 4.0000 mg | Freq: Four times a day (QID) | INTRAMUSCULAR | Status: DC | PRN

## 2020-08-11 MED ORDER — ONDANSETRON HCL 4 MG PO TABS
4.0000 mg | ORAL_TABLET | ORAL | Status: DC | PRN
  Filled 2020-08-11: qty 1

## 2020-08-11 NOTE — OB Triage Note (Signed)
Patient is [redacted]w[redacted]d, G2P1 presents with contractions every 1-2 minutes. No LOF, No bleeding, +FM. VSS. Monitors applied and assessing.

## 2020-08-11 NOTE — H&P (Cosign Needed Addendum)
OB History & Physical   History of Present Illness:  Chief Complaint:   HPI:  Madison Nichols is a 20 y.o. G1P0 female at [redacted]w[redacted]d dated by 8 week u/s.  She presents to L&D for active labor  She reports:  -active fetal movement -no leakage of fluid -no vaginal bleeding -onset of contractions at 1000  Pregnancy Issues: 1. Anemia 2. GERD 3. Uterine size date discrepancy    Maternal Medical History:   Past Medical History:  Diagnosis Date  . Iron deficiency anemia     Past Surgical History:  Procedure Laterality Date  . NO PAST SURGERIES      No Known Allergies  Prior to Admission medications   Medication Sig Start Date End Date Taking? Authorizing Provider  famotidine (PEPCID) 20 MG tablet TAKE 1 TABLET BY MOUTH TWICE A DAY AS NEEDED FOR HEARTBURN 07/24/20  Yes [provider]  ferrous sulfate 325 (65 FE) MG EC tablet Take 1 tablet (325 mg total) by mouth 2 (two) times daily. 04/02/19  Yes Rickard Patience, MD  sulfamethoxazole-trimethoprim (BACTRIM DS,SEPTRA DS) 800-160 MG tablet Take 1 tablet by mouth 2 (two) times daily. Patient not taking: Reported on 08/11/2020    [provider]  traMADol (ULTRAM) 50 MG tablet Take 1 tablet (50 mg total) by mouth every 6 (six) hours as needed. Patient not taking: Reported on 08/11/2020 08/20/19   Chinita Pester, FNP  TRI-LO-MARZIA 0.18/0.215/0.25 MG-25 MCG tab Take 1 tablet by mouth daily. Patient not taking: Reported on 08/06/2020 03/23/18   [provider]     Prenatal care site: [facility]  Social History: She  reports that she has never smoked. She has never used smokeless tobacco. She reports previous alcohol use. She reports that she does not use drugs.  Family History: Family history is unknown by patient.   Review of Systems: A full review of systems was performed and negative except as noted in the HPI.    Physical Exam:  Vital Signs: BP 122/71 (BP Location: Right Arm)   Pulse 88   Temp 98.7 F (37.1  C) (Oral)   Resp 16   Ht 4\' 11"  (1.499 m)   Wt 54.4 kg   LMP 10/11/2019 (Approximate)   BMI 24.24 kg/m   General:   alert and cooperative  Skin:  normal  Neurologic:    Alert & oriented x 3  Lungs:   nl effort  Heart:   regular rate and rhythm  Abdomen:  gravid, soft between UCs  Extremities: : non-tender, symmetric     Pertinent Results:  Prenatal Labs: Blood type/Rh O pos  Antibody screen neg  Rubella Immune  Varicella Immune  RPR NR  HBsAg Neg  HIV   GC neg  Chlamydia neg  Genetic screening negative  1 hour GTT 104  3 hour GTT   GBS neg   FHT: FHR: 130 bpm, variability: moderate,  accelerations:  Present,  decelerations:  Absent Category/reactivity:  Category I TOCO: regular, every 2 minutes SVE:   /   /       Assessment:  Madison Nichols is a 20 y.o. G1P0 female at [redacted]w[redacted]d with active labor.   Plan:  1. Admit to Labor & Delivery; consents reviewed and obtained  2. Fetal Well being  - Fetal Tracing: Cat I - GBS neg - Presentation: vtx confirmed by SVE   3. Routine OB: - Prenatal labs reviewed, as above - Rh pos - CBC & T&S on admit - Clear  fluids, IVF  4. Monitoring of Labor -  Contractions external toco in place -  Pelvis proven to 5lbs -  Plan for continuous fetal monitoring  -  Maternal pain control as desired: IVPM, nitrous, regional anesthesia - Anticipate vaginal delivery  5. Post Partum Planning: - Infant feeding: Breastfeeding - Contraception: TBD - Tdap given 06/10/20  Haroldine Laws, CNM 08/11/2020 1:21 PM

## 2020-08-11 NOTE — Discharge Summary (Signed)
Obstetrical Discharge Summary  Patient Name: Madison Nichols DOB: April 16, 2001 MRN: [id number]  Date of Admission: 08/11/2020 Date of Delivery: 08/11/20 Delivered by: Anselm Pancoast Date of Discharge: 08/12/2020  Primary OB: [facility] [id number] last menstrual period was 10/11/2019 (approximate). EDC Estimated Date of Delivery: 08/15/20 Gestational Age at Delivery: [redacted]w[redacted]d   Antepartum complications:  1. Anemia 2. GERD 3. Uterine size date discrepancy  Admitting Diagnosis: Active labor Secondary Diagnosis: Patient Active Problem List   Diagnosis Date Noted  . NSVD (normal spontaneous vaginal delivery) 08/11/2020  . Iron deficiency anemia 11/11/2017  . UTI (urinary tract infection) 06/10/2017  . Rubella non-immune status, antepartum 06/09/2017  . Exposure to [place] virus 06/07/2017    Augmentation: N/A Complications: None  Intrapartum complications/course: see delivery summary Delivery Type: spontaneous vaginal delivery Anesthesia: None Placenta: spontaneous Laceration: Right Labial Episiotomy: none Newborn Data: Live born female  Birth Weight:  6#10 APGAR: 42, 9  Newborn Delivery   Birth date/time: 08/11/2020 15:21:00 Delivery type: Vaginal, Spontaneous      20yo G4P1021 at 39+4wks presenting with active labor, AROM with clear fluid.  She progressed to complete and pushed over an intact perineum and delivered the fetal head, followed promptly by the shoulders. She was in control the whole time, and the baby placed on the maternal abdomen. Delayed cord clamping and the FOB cut the baby's cord, while the baby was skin to skin. The placenta delivered spontaneously and intact, with trailing membranes. Small infraclitoral laceration that was hemostatic and did not require a stitch. Mom and baby tolerated the procedure well.   Postpartum Procedures: none  Post partum course:  Patient had an uncomplicated postpartum course.  By time of discharge on PPD#1, her pain was  controlled on oral pain medications; she had appropriate lochia and was ambulating, voiding without difficulty and tolerating regular diet.  She was deemed stable for discharge to home.    Discharge Physical Exam:  BP 111/75 (BP Location: Left Arm)   Pulse 83   Temp 98.4 F (36.9 C) (Oral)   Resp 18   Ht 4\' 11"  (1.499 m)   Wt 54.4 kg   LMP 10/11/2019 (Approximate)   SpO2 99%   Breastfeeding Unknown   BMI 24.24 kg/m   General: alert and no distress Pulm: normal respiratory effort Lochia: appropriate Abdomen: soft, NT Uterine Fundus: firm, below umbilicus Perineum: intact Extremities: No evidence of DVT seen on physical exam. No lower extremity edema. Edinburgh:  Edinburgh Postnatal Depression Scale Screening Tool 08/11/2020 08/11/2020  I have been able to laugh and see the funny side of things. 0 (No Data)  I have looked forward with enjoyment to things. 0 -  I have blamed myself unnecessarily when things went wrong. 1 -  I have been anxious or worried for no good reason. 0 -  I have felt scared or panicky for no good reason. 0 -  Things have been getting on top of me. 0 -  I have been so unhappy that I have had difficulty sleeping. 0 -  I have felt sad or miserable. 0 -  I have been so unhappy that I have been crying. 0 -  The thought of harming myself has occurred to me. 0 -  Edinburgh Postnatal Depression Scale Total 1 -     Labs: CBC Latest Ref Rng & Units 08/12/2020 08/11/2020 08/05/2020  WBC 4.0 - 10.5 K/uL 9.4 7.0 5.8  Hemoglobin 12.0 - 15.0 g/dL 7.8(L) 8.9(L) 7.8(L)  Hematocrit 36.0 - 46.0 %  26.3(L) 29.5(L) 26.6(L)  Platelets 150 - 400 K/uL 212 214 212   O POS Hemoglobin  Date Value Ref Range Status  08/12/2020 7.8 (L) 12.0 - 15.0 g/dL Final   HCT  Date Value Ref Range Status  08/12/2020 26.3 (L) 36.0 - 46.0 % Final    Disposition: stable, discharge to home Baby Feeding: formula Baby Disposition: home with mom  Contraception: OCPs  Prenatal Labs:  Blood  type/Rh O pos  Antibody screen neg  Rubella Immune  Varicella Immune  RPR NR  HBsAg Neg  HIV   GC neg  Chlamydia neg  Genetic screening negative  1 hour GTT 104  3 hour GTT   GBS neg    Rh Immune globulin given: n/a Rubella vaccine given: Immune Varicella vaccine given: Immune Tdap vaccine given in AP or PP setting: given 06/10/20 Flu vaccine given in AP or PP setting: unknow  Plan: Velina Drollinger was discharged to home in good condition. Follow-up appointment with delivering provider in 6 weeks.  Discharge Instructions: Per After Visit Summary. Activity: Advance as tolerated. Pelvic rest for 6 weeks.   Diet: Regular Discharge Medications: Allergies as of 08/12/2020   No Known Allergies     Medication List    STOP taking these medications   sulfamethoxazole-trimethoprim 800-160 MG tablet Commonly known as: BACTRIM DS   traMADol 50 MG tablet Commonly known as: Ultram   Tri-Lo-Marzia 0.18/0.215/0.25 MG-25 MCG tab Generic drug: Norgestimate-Ethinyl Estradiol Triphasic     TAKE these medications   acetaminophen 325 MG tablet Commonly known as: Tylenol Take 2 tablets (650 mg total) by mouth every 4 (four) hours as needed (for pain scale < 4).   benzocaine-Menthol 20-0.5 % Aero Commonly known as: DERMOPLAST Apply 1 application topically as needed for irritation (perineal discomfort).   coconut oil Oil Apply 1 application topically as needed.   docusate sodium 100 MG capsule Commonly known as: COLACE Take 1 capsule (100 mg total) by mouth 2 (two) times daily.   famotidine 20 MG tablet Commonly known as: PEPCID TAKE 1 TABLET BY MOUTH TWICE A DAY AS NEEDED FOR HEARTBURN   ferrous sulfate 325 (65 FE) MG EC tablet Take 1 tablet (325 mg total) by mouth 2 (two) times daily.   ibuprofen 600 MG tablet Commonly known as: ADVIL Take 1 tablet (600 mg total) by mouth every 6 (six) hours as needed for headache, mild pain or moderate pain.   prenatal multivitamin  Tabs tablet Take 1 tablet by mouth daily at 12 noon.   witch hazel-glycerin pad Commonly known as: TUCKS Apply 1 application topically as needed for hemorrhoids.      Outpatient follow up:   Follow-up Information    Haroldine Laws, CNM. Schedule an appointment as soon as possible for a visit in 6 week(s).   Specialty: Certified Nurse Midwife Why: postpartum appointment, may opt to do PP visit at [facility] if desired.  Contact information: [address] [place] [address] [phone]               Signed: Randa Ngo, CNM 08/12/2020 11:16 AM

## 2020-08-12 LAB — CBC
HCT: 26.3 % — ABNORMAL LOW (ref 36.0–46.0)
Hemoglobin: 7.8 g/dL — ABNORMAL LOW (ref 12.0–15.0)
MCH: 23.5 pg — ABNORMAL LOW (ref 26.0–34.0)
MCHC: 29.7 g/dL — ABNORMAL LOW (ref 30.0–36.0)
MCV: 79.2 fL — ABNORMAL LOW (ref 80.0–100.0)
Platelets: 212 10*3/uL (ref 150–400)
RBC: 3.32 MIL/uL — ABNORMAL LOW (ref 3.87–5.11)
RDW: 20.6 % — ABNORMAL HIGH (ref 11.5–15.5)
WBC: 9.4 10*3/uL (ref 4.0–10.5)
nRBC: 0 % (ref 0.0–0.2)

## 2020-08-12 MED ORDER — COCONUT OIL OIL: 1.0000 "application " | TOPICAL_OIL | 0 refills | Status: DC | PRN

## 2020-08-12 MED ORDER — IBUPROFEN 600 MG PO TABS: 600.0000 mg | ORAL_TABLET | Freq: Four times a day (QID) | ORAL | 0 refills | Status: DC | PRN

## 2020-08-12 MED ORDER — WITCH HAZEL-GLYCERIN EX PADS: 1.0000 "application " | MEDICATED_PAD | CUTANEOUS | 12 refills | Status: DC | PRN

## 2020-08-12 MED ORDER — ACETAMINOPHEN 325 MG PO TABS: 650.0000 mg | ORAL_TABLET | ORAL | Status: DC | PRN

## 2020-08-12 MED ORDER — BENZOCAINE-MENTHOL 20-0.5 % EX AERO
1.0000 | INHALATION_SPRAY | CUTANEOUS | Status: DC | PRN
Start: 2020-08-12 — End: 2021-09-10

## 2020-08-12 MED ORDER — PRENATAL MULTIVITAMIN CH: 1.0000 | ORAL_TABLET | Freq: Every day | ORAL | Status: DC

## 2020-08-12 MED ORDER — DOCUSATE SODIUM 100 MG PO CAPS: 100.0000 mg | ORAL_CAPSULE | Freq: Two times a day (BID) | ORAL | 0 refills | Status: DC

## 2020-08-12 NOTE — Discharge Instructions (Signed)
Discharge Instructions:   Follow-up Appointment: Thursday, 4/21 at 2:45pm with Haroldine Laws, CNM at [facility]!   If there are any new medications, they have been ordered and will be available for pickup at the listed pharmacy on your way home from the hospital.   Call office if you have any of the following: headache, visual changes, fever >101.0 F, chills, shortness of breath, breast concerns, excessive vaginal bleeding, incision drainage or problems, leg pain or redness, depression or any other concerns. If you have vaginal discharge with an odor, let your doctor know.   It is normal to bleed for up to 6 weeks. You should not soak through more than 1 pad in 1 hour. If you have a blood clot larger than your fist with continued bleeding, call your doctor.   Activity: Do not lift > 10 lbs for 6 weeks (do not lift anything heavier than your baby). No intercourse, tampons, swimming pools, hot tubs, baths (only showers) for 6 weeks.  No driving for 1-2 weeks. Continue prenatal vitamin, especially if breastfeeding. Increase calories and fluids (water) while breastfeeding.   Your milk will come in, in the next couple of days (right now it is colostrum). You may have a slight fever when your milk comes in, but it should go away on its own.  If it does not, and rises above 101 F please call the doctor. You will also feel achy and your breasts will be firm. They will also start to leak. If you are breastfeeding, continue as you have been and you can pump/express milk for comfort.   If you have too much milk, your breasts can become engorged, which could lead to mastitis. This is an infection of the milk ducts. It can be very painful and you will need to notify your doctor to obtain a prescription for antibiotics. You can also treat it with a shower or hot/cold compress.   For concerns about your baby, please call your pediatrician.  For breastfeeding concerns, the lactation consultant can be  reached at [phone].   Postpartum blues (feelings of happy one minute and sad another minute) are normal for the first few weeks but if it gets worse let your doctor know.   Congratulations! We enjoyed caring for you and your new bundle of joy!

## 2020-08-12 NOTE — Progress Notes (Signed)
Patient discharged home with infant. FOB present at discharge. Discharge instructions and prescriptions given and reviewed with patient. Patient verbalized understanding.   Follow-up appointment scheduled in 6 weeks at [facility]. Explained to pt that she could call and schedule at [facility] if she would rather go there!   Escorted out by volunteers.

## 2020-08-12 NOTE — Progress Notes (Signed)
Post Partum Day 1 Subjective: Doing well, no complaints.  Tolerating regular diet, pain with PO meds, voiding and ambulating without difficulty.  No CP SOB Fever,Chills, N/V or leg pain; denies nipple or breast pain no HA change of vision, RUQ/epigastric pain  Objective: BP 111/75 (BP Location: Left Arm)   Pulse 83   Temp 98.4 F (36.9 C) (Oral)   Resp 18   Ht 4\' 11"  (1.499 m)   Wt 54.4 kg   LMP 10/11/2019 (Approximate)   SpO2 99%   Breastfeeding Unknown   BMI 24.24 kg/m    Physical Exam:  General: NAD Breasts: soft/nontender CV: RRR Pulm: nl effort, CTABL Abdomen: soft, NT, BS x 4 Perineum: minimal edema, lacerations repair well approximated Lochia: small Uterine Fundus: fundus firm and 1 fb below umbilicus DVT Evaluation: no cords, ttp LEs   Recent Labs    08/11/20 1312 08/12/20 0432  HGB 8.9* 7.8*  HCT 29.5* 26.3*  WBC 7.0 9.4  PLT 214 212    Assessment/Plan: 20 y.o. G2P2002 postpartum day # 1  - Continue routine PP care - encouraged snug bra for bottlefeeding.  - Acute blood loss anemia - hemodynamically stable and asymptomatic; cont po ferrous sulfate BID with stool softeners  - Immunization status: all Imms up to date    Disposition: Does desire Dc home today.    36, CNM 08/12/2020  11:05 AM

## 2020-08-13 ENCOUNTER — Inpatient Hospital Stay: Payer: Medicaid Other

## 2020-08-13 LAB — RPR: RPR Ser Ql: NONREACTIVE

## 2020-09-02 ENCOUNTER — Inpatient Hospital Stay: Payer: Medicaid Other | Attending: Oncology

## 2020-09-02 DIAGNOSIS — R5383 Other fatigue: Secondary | ICD-10-CM | POA: Insufficient documentation

## 2020-09-02 DIAGNOSIS — O99019 Anemia complicating pregnancy, unspecified trimester: Secondary | ICD-10-CM | POA: Insufficient documentation

## 2020-09-02 DIAGNOSIS — Z79899 Other long term (current) drug therapy: Secondary | ICD-10-CM | POA: Diagnosis not present

## 2020-09-02 DIAGNOSIS — D509 Iron deficiency anemia, unspecified: Secondary | ICD-10-CM | POA: Diagnosis present

## 2020-09-02 DIAGNOSIS — D508 Other iron deficiency anemias: Secondary | ICD-10-CM

## 2020-09-02 LAB — CBC WITH DIFFERENTIAL/PLATELET
Abs Immature Granulocytes: 0.01 10*3/uL (ref 0.00–0.07)
Basophils Absolute: 0 10*3/uL (ref 0.0–0.1)
Basophils Relative: 0 %
Eosinophils Absolute: 0.1 10*3/uL (ref 0.0–0.5)
Eosinophils Relative: 2 %
HCT: 37.9 % (ref 36.0–46.0)
Hemoglobin: 11.5 g/dL — ABNORMAL LOW (ref 12.0–15.0)
Immature Granulocytes: 0 %
Lymphocytes Relative: 49 %
Lymphs Abs: 2.2 10*3/uL (ref 0.7–4.0)
MCH: 25.1 pg — ABNORMAL LOW (ref 26.0–34.0)
MCHC: 30.3 g/dL (ref 30.0–36.0)
MCV: 82.6 fL (ref 80.0–100.0)
Monocytes Absolute: 0.3 10*3/uL (ref 0.1–1.0)
Monocytes Relative: 6 %
Neutro Abs: 1.9 10*3/uL (ref 1.7–7.7)
Neutrophils Relative %: 43 %
Platelets: 242 10*3/uL (ref 150–400)
RBC: 4.59 MIL/uL (ref 3.87–5.11)
RDW: 21.4 % — ABNORMAL HIGH (ref 11.5–15.5)
WBC: 4.4 10*3/uL (ref 4.0–10.5)
nRBC: 0 % (ref 0.0–0.2)

## 2020-09-02 LAB — IRON AND TIBC
Iron: 57 ug/dL (ref 28–170)
Saturation Ratios: 13 % (ref 10.4–31.8)
TIBC: 441 ug/dL (ref 250–450)
UIBC: 384 ug/dL

## 2020-09-02 LAB — FERRITIN: Ferritin: 13 ng/mL (ref 11–307)

## 2020-09-03 ENCOUNTER — Other Ambulatory Visit: Payer: Medicaid Other

## 2020-09-04 ENCOUNTER — Inpatient Hospital Stay (HOSPITAL_BASED_OUTPATIENT_CLINIC_OR_DEPARTMENT_OTHER): Payer: Medicaid Other | Admitting: Oncology

## 2020-09-04 ENCOUNTER — Encounter: Payer: Self-pay | Admitting: Oncology

## 2020-09-04 ENCOUNTER — Other Ambulatory Visit: Payer: Self-pay

## 2020-09-04 DIAGNOSIS — O99019 Anemia complicating pregnancy, unspecified trimester: Secondary | ICD-10-CM | POA: Diagnosis not present

## 2020-09-04 DIAGNOSIS — D508 Other iron deficiency anemias: Secondary | ICD-10-CM

## 2020-09-04 NOTE — Progress Notes (Signed)
HEMATOLOGY-ONCOLOGY TeleHEALTH VISIT PROGRESS NOTE  I connected with Madison Nichols on 09/04/20  at  2:00 PM EDT by video enabled telemedicine visit and verified that I am speaking with the correct person using two identifiers. I discussed the limitations, risks, security and privacy concerns of performing an evaluation and management service by telemedicine and the availability of in-person appointments. The patient expressed understanding and agreed to proceed.   Other persons participating in the visit and their role in the encounter:  None  Patient's location: Home  Provider's location: office Chief Complaint:    INTERVAL HISTORY Madison Nichols is a 20 y.o. female who has above history reviewed by me today presents for follow up visit for management of iron deficiency anemia during pregnancy Problems and complaints are listed below:  Patient had delivered her baby in March.  She reports feeling well today.  No new complaints. Fatigue has improved.  Review of Systems  Constitutional: Negative for appetite change, chills, fatigue and fever.  HENT:   Negative for hearing loss and voice change.   Eyes: Negative for eye problems.  Respiratory: Negative for chest tightness and cough.   Cardiovascular: Negative for chest pain.  Gastrointestinal: Negative for abdominal distention, abdominal pain and blood in stool.  Endocrine: Negative for hot flashes.  Genitourinary: Negative for difficulty urinating and frequency.   Musculoskeletal: Negative for arthralgias.  Skin: Negative for itching and rash.  Neurological: Negative for extremity weakness.  Hematological: Negative for adenopathy.  Psychiatric/Behavioral: Negative for confusion.    Past Medical History:  Diagnosis Date  . Iron deficiency anemia    Past Surgical History:  Procedure Laterality Date  . NO PAST SURGERIES      Family History  Family history unknown: Yes    Social History   Socioeconomic History  . Marital  status: Single    Spouse name: Not on file  . Number of children: Not on file  . Years of education: Not on file  . Highest education level: Not on file  Occupational History  . Not on file  Tobacco Use  . Smoking status: Never Smoker  . Smokeless tobacco: Never Used  Vaping Use  . Vaping Use: Never used  Substance and Sexual Activity  . Alcohol use: Not Currently  . Drug use: Never  . Sexual activity: Yes    Birth control/protection: Pill  Other Topics Concern  . Not on file  Social History Narrative  . Not on file   Social Determinants of Health   Financial Resource Strain: Not on file  Food Insecurity: Not on file  Transportation Needs: Not on file  Physical Activity: Not on file  Stress: Not on file  Social Connections: Not on file  Intimate Partner Violence: Not on file    Current Outpatient Medications on File Prior to Visit  Medication Sig Dispense Refill  . acetaminophen (TYLENOL) 325 MG tablet Take 2 tablets (650 mg total) by mouth every 4 (four) hours as needed (for pain scale < 4).    . ferrous sulfate 325 (65 FE) MG EC tablet Take 1 tablet (325 mg total) by mouth 2 (two) times daily. 120 tablet 3  . ibuprofen (ADVIL) 600 MG tablet Take 1 tablet (600 mg total) by mouth every 6 (six) hours as needed for headache, mild pain or moderate pain. 30 tablet 0  . Prenatal Vit-Fe Fumarate-FA (PRENATAL MULTIVITAMIN) TABS tablet Take 1 tablet by mouth daily at 12 noon.    . benzocaine-Menthol (DERMOPLAST) 20-0.5 % AERO Apply  1 application topically as needed for irritation (perineal discomfort). (Patient not taking: Reported on 09/04/2020)    . coconut oil OIL Apply 1 application topically as needed. (Patient not taking: Reported on 09/04/2020)  0  . docusate sodium (COLACE) 100 MG capsule Take 1 capsule (100 mg total) by mouth 2 (two) times daily. (Patient not taking: Reported on 09/04/2020) 30 capsule 0  . famotidine (PEPCID) 20 MG tablet TAKE 1 TABLET BY MOUTH TWICE A DAY AS  NEEDED FOR HEARTBURN (Patient not taking: Reported on 09/04/2020)    . witch hazel-glycerin (TUCKS) pad Apply 1 application topically as needed for hemorrhoids. (Patient not taking: Reported on 09/04/2020) 40 each 12   No current facility-administered medications on file prior to visit.    No Known Allergies     Observations/Objective: Today's Vitals   09/04/20 1416  PainSc: 0-No pain   There is no height or weight on file to calculate BMI.  Physical Exam Neurological:     Mental Status: She is alert.     CBC    Component Value Date/Time   WBC 4.4 09/02/2020 1335   RBC 4.59 09/02/2020 1335   HGB 11.5 (L) 09/02/2020 1335   HCT 37.9 09/02/2020 1335   PLT 242 09/02/2020 1335   MCV 82.6 09/02/2020 1335   MCH 25.1 (L) 09/02/2020 1335   MCHC 30.3 09/02/2020 1335   RDW 21.4 (H) 09/02/2020 1335   LYMPHSABS 2.2 09/02/2020 1335   MONOABS 0.3 09/02/2020 1335   EOSABS 0.1 09/02/2020 1335   BASOSABS 0.0 09/02/2020 1335    CMP     Component Value Date/Time   NA 136 08/05/2020 1309   K 4.0 08/05/2020 1309   CL 105 08/05/2020 1309   CO2 20 (L) 08/05/2020 1309   GLUCOSE 139 (H) 08/05/2020 1309   BUN 10 08/05/2020 1309   CREATININE 0.58 08/05/2020 1309   CALCIUM 8.4 (L) 08/05/2020 1309   PROT 6.5 08/05/2020 1309   ALBUMIN 2.9 (L) 08/05/2020 1309   AST 17 08/05/2020 1309   ALT 5 08/05/2020 1309   ALKPHOS 140 (H) 08/05/2020 1309   BILITOT 0.5 08/05/2020 1309   GFRNONAA >60 08/05/2020 1309   GFRAA >60 08/20/2019 1035     Assessment and Plan: 1. Other iron deficiency anemia     Labs reviewed and discussed with patient.  Both hemoglobin and iron panel have improved.  Recommend patient to proceed with Venofer 200 mg x 1 to further improve the hemoglobin and iron store. Recommend patient to continue prenatal vitamin that contains with iron. Patient will be discharged from our clinic.  I recommend patient to continue follow-up with primary care provider or OB/GYN.  If needed she can  be referred back to our clinic.   I discussed the assessment and treatment plan with the patient. The patient was provided an opportunity to ask questions and all were answered. The patient agreed with the plan and demonstrated an understanding of the instructions.  The patient was advised to call back or seek an in-person evaluation if the symptoms worsen or if the condition fails to improve as anticipated.   Rickard Patience, MD 09/04/2020 8:28 PM

## 2020-09-04 NOTE — Progress Notes (Signed)
Patient denies new problems/concerns today.   °

## 2020-09-05 ENCOUNTER — Inpatient Hospital Stay: Payer: Medicaid Other

## 2020-09-05 VITALS — BP 99/69 | HR 90 | Temp 97.9°F | Resp 16

## 2020-09-05 DIAGNOSIS — O99019 Anemia complicating pregnancy, unspecified trimester: Secondary | ICD-10-CM | POA: Diagnosis not present

## 2020-09-05 DIAGNOSIS — D508 Other iron deficiency anemias: Secondary | ICD-10-CM

## 2020-09-05 MED ORDER — IRON SUCROSE 20 MG/ML IV SOLN
200.0000 mg | Freq: Once | INTRAVENOUS | Status: AC
  Administered 2020-09-05: 200 mg via INTRAVENOUS
  Filled 2020-09-05: qty 10

## 2020-10-02 IMAGING — US US OB < 14 WEEKS - US OB TV
1 series · 14 of 28 positions shown · non-contrast
Comparison: None.

CLINICAL DATA: Pregnancy in first trimester, multigravida,
uncertain LMP

EXAM:
OBSTETRIC <14 WK US AND TRANSVAGINAL OB US
TECHNIQUE: Both transabdominal and transvaginal ultrasound examinations were
performed for complete evaluation of the gestation as well as the
maternal uterus, adnexal regions, and pelvic cul-de-sac.
Transvaginal technique was performed to assess early pregnancy.

[Series 1: us ob less than 14 weeks with ob transvaginal · 14 of 110 slices shown]
[im 5/110]
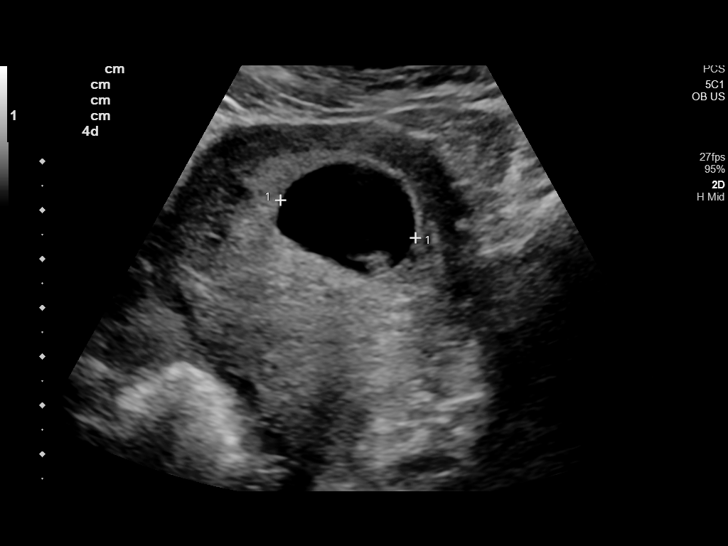
[im 13/110]
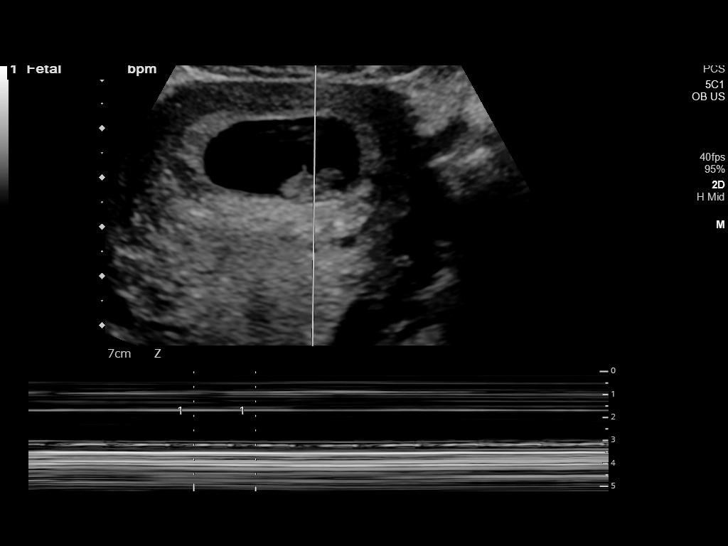
[im 21/110]
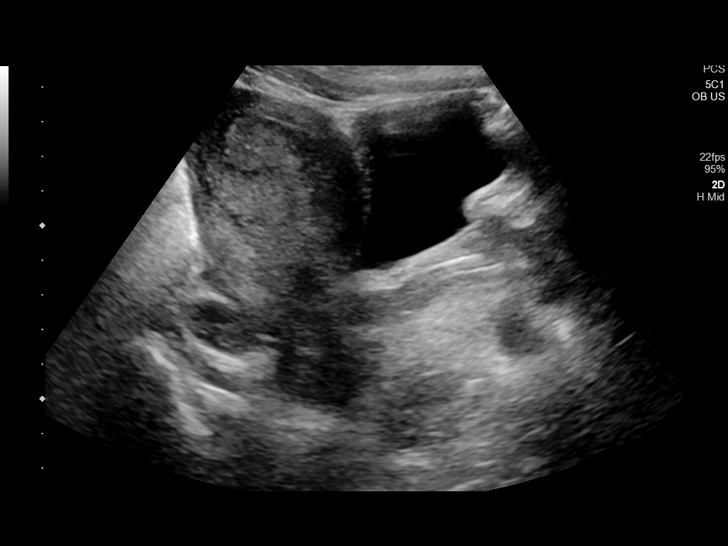
[im 29/110]
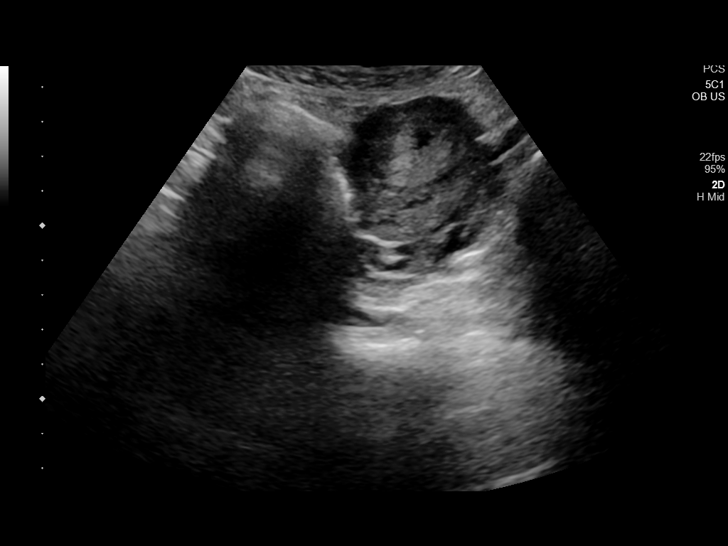
[im 37/110]
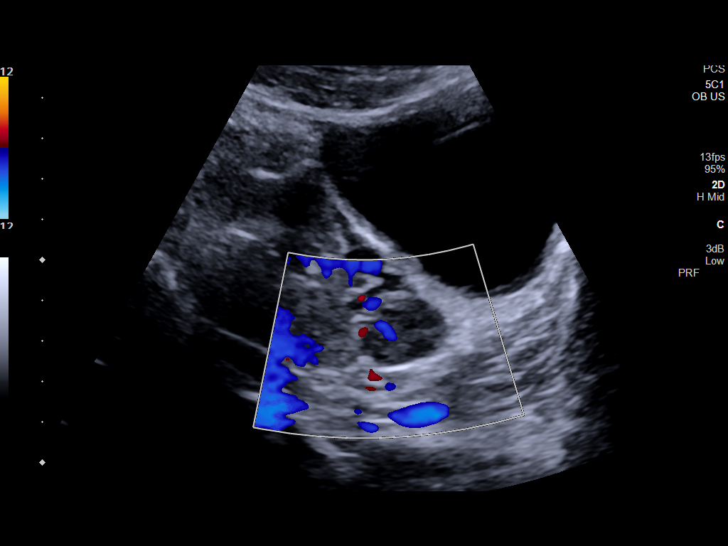
[im 45/110]
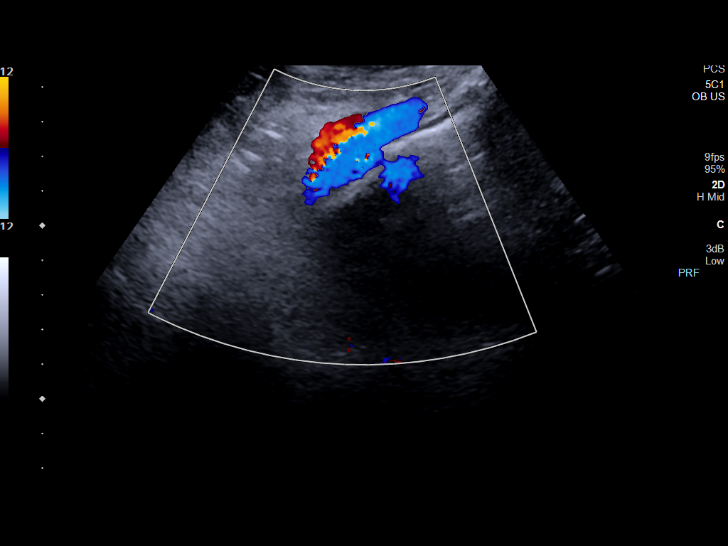
[im 53/110]
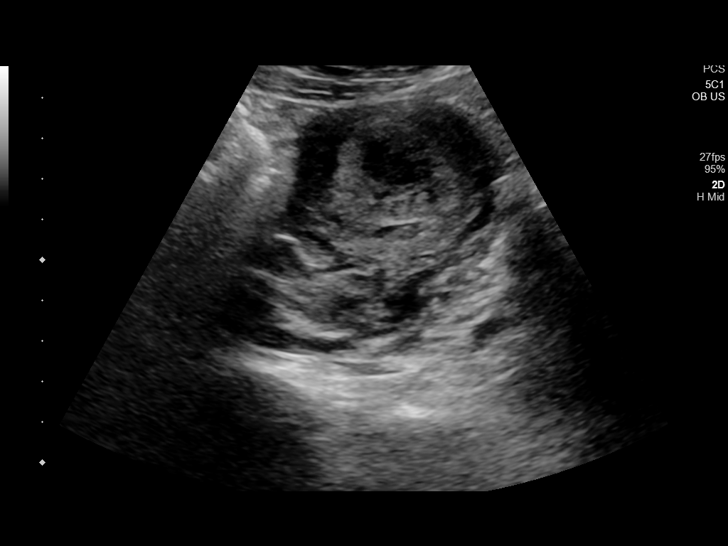
[im 61/110]
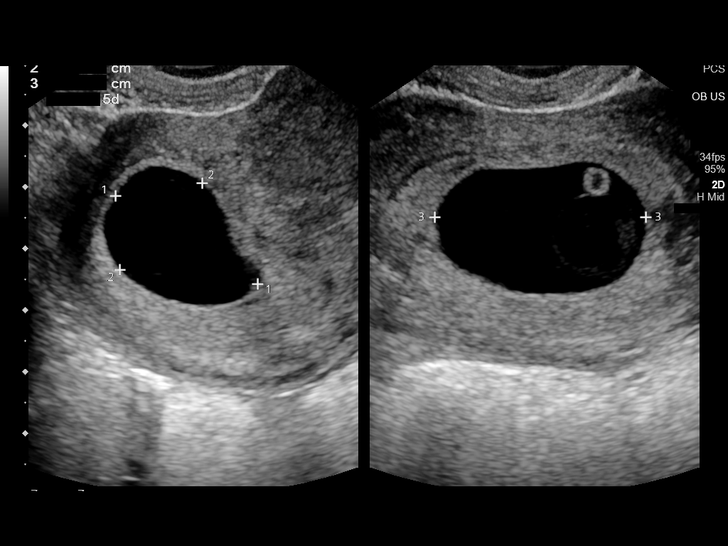
[im 69/110]
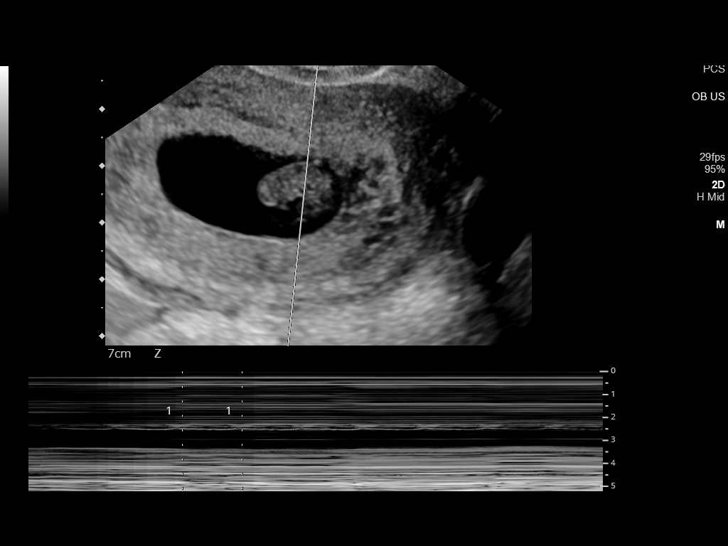
[im 77/110]
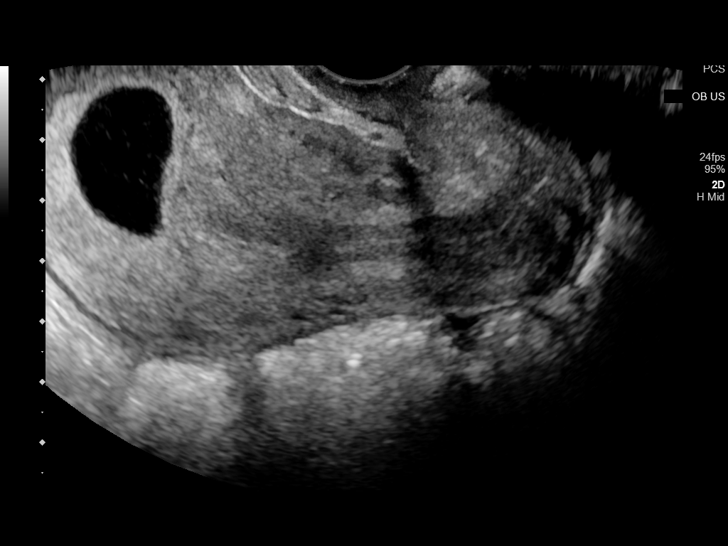
[im 85/110]
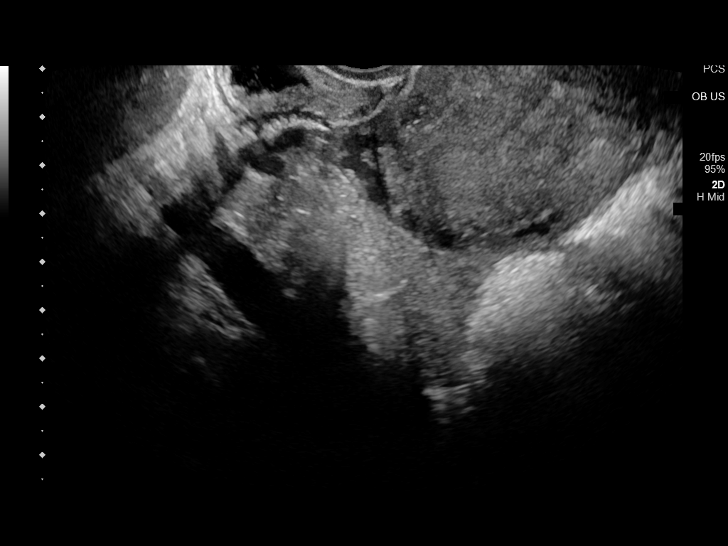
[im 93/110]
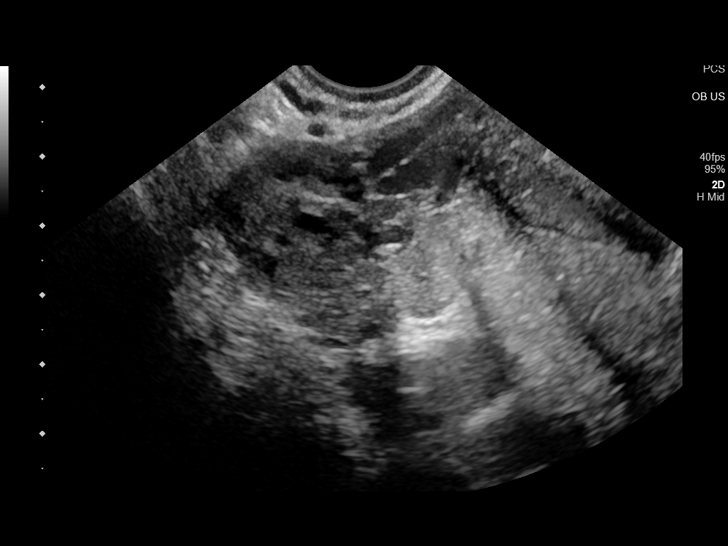
[im 101/110]
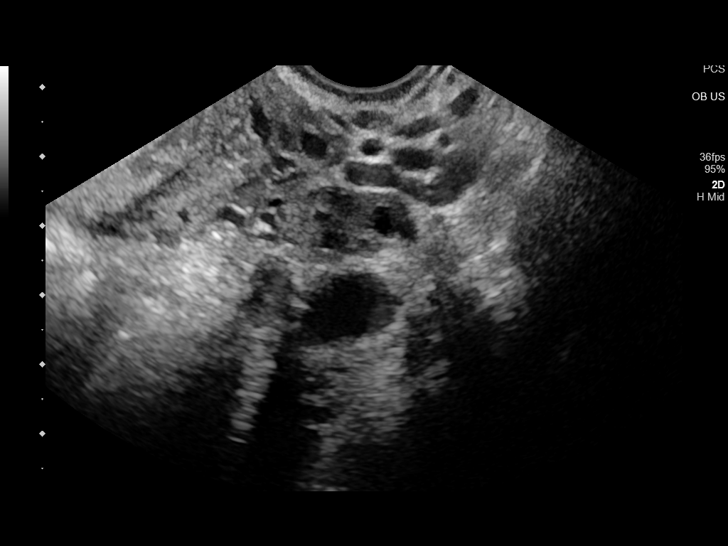
[im 110/110]
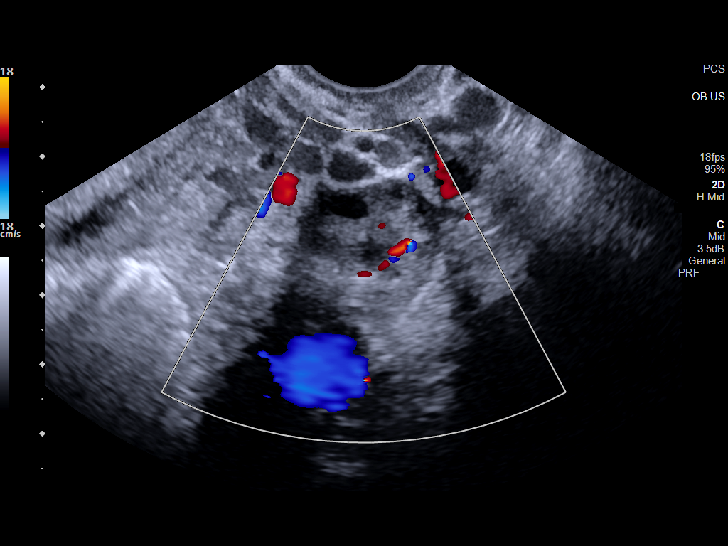

[14 of 28 positions shown; findings below may reference images not displayed]

FINDINGS: Intrauterine gestational sac: Present, single

Yolk sac:  Present

Embryo:  Present

Cardiac Activity: Present

Heart Rate: 171  bpm

CRL: 15.8  mm   8 w   0 d                  US EDC: 08/15/2020

Subchorionic hemorrhage:  None visualized.

Maternal uterus/adnexae:

Ovaries normal appearance.

No free pelvic fluid or adnexal masses.
IMPRESSION: Single live intrauterine gestation at 8 weeks 0 days EGA by
crown-rump length.

No acute abnormalities.

## 2021-06-01 HISTORY — PX: WISDOM TOOTH EXTRACTION: SHX21

## 2021-07-16 ENCOUNTER — Encounter: Payer: Self-pay | Admitting: Obstetrics

## 2021-07-21 ENCOUNTER — Encounter: Payer: Self-pay | Admitting: Oncology

## 2021-09-10 ENCOUNTER — Ambulatory Visit (INDEPENDENT_AMBULATORY_CARE_PROVIDER_SITE_OTHER): Payer: BLUE CROSS/BLUE SHIELD | Admitting: Obstetrics and Gynecology

## 2021-09-10 ENCOUNTER — Encounter: Payer: Self-pay | Admitting: Obstetrics and Gynecology

## 2021-09-10 VITALS — BP 97/64 | HR 80 | Ht 59.0 in | Wt 113.8 lb

## 2021-09-10 DIAGNOSIS — Z3009 Encounter for other general counseling and advice on contraception: Secondary | ICD-10-CM | POA: Diagnosis not present

## 2021-09-10 NOTE — Progress Notes (Signed)
Patient presents today to discuss birth control options. She states she has regular periods but interested in birth control due to not wanting to become pregnant anytime soon. She states a history of depo and would not like to use that again. Patient states no other questions or concerns at this time. ?

## 2021-09-10 NOTE — Progress Notes (Signed)
HPI: ?     [name] is a 21 y.o. [phone] who LMP was Patient's last menstrual period was 08/25/2021 (approximate). ? ?Subjective:  ? ?She presents today for birth control consultation.  She has 2 children and has previously used Depo-Provera but was not happy with it.  She is currently having normal cycles and using condoms for birth control.  She would like to hear about other forms of birth control. ?She also specifically asked about permanent sterilization and if it was possible for someone of her age. ? ?  Hx: ?The following portions of the patient's history were reviewed and updated as appropriate: ?            She  has a past medical history of Iron deficiency anemia. ?She does not have any pertinent problems on file. ?She  has a past surgical history that includes No past surgeries. ?Her Family history is unknown by patient. ?She  reports that she has never smoked. She has never used smokeless tobacco. She reports that she does not currently use alcohol. She reports that she does not use drugs. ?She currently has no medications in their medication list. ?She has No Known Allergies. ?      ?Review of Systems:  ?Review of Systems ? ?Constitutional: Denied constitutional symptoms, night sweats, recent illness, fatigue, fever, insomnia and weight loss.  ?Eyes: Denied eye symptoms, eye pain, photophobia, vision change and visual disturbance.  ?Ears/Nose/Throat/Neck: Denied ear, nose, throat or neck symptoms, hearing loss, nasal discharge, sinus congestion and sore throat.  ?Cardiovascular: Denied cardiovascular symptoms, arrhythmia, chest pain/pressure, edema, exercise intolerance, orthopnea and palpitations.  ?Respiratory: Denied pulmonary symptoms, asthma, pleuritic pain, productive sputum, cough, dyspnea and wheezing.  ?Gastrointestinal: Denied, gastro-esophageal reflux, melena, nausea and vomiting.  ?Genitourinary: Denied genitourinary symptoms including symptomatic vaginal discharge, pelvic relaxation  issues, and urinary complaints.  ?Musculoskeletal: Denied musculoskeletal symptoms, stiffness, swelling, muscle weakness and myalgia.  ?Dermatologic: Denied dermatology symptoms, rash and scar.  ?Neurologic: Denied neurology symptoms, dizziness, headache, neck pain and syncope.  ?Psychiatric: Denied psychiatric symptoms, anxiety and depression.  ?Endocrine: Denied endocrine symptoms including hot flashes and night sweats.  ? ?Meds: ?  ?No current outpatient medications on file prior to visit.  ? ?No current facility-administered medications on file prior to visit.  ? ? ? ? ?Objective:  ?  ? ?Vitals:  ? 09/10/21 1330  ?BP: 97/64  ?Pulse: 80  ? ?Filed Weights  ? 09/10/21 1330  ?Weight: 113 lb 12.8 oz (51.6 kg)  ? ?  ?          ?        ? ?Assessment:  ?  ?G2P2002 ?Patient Active Problem List  ? Diagnosis Date Noted  ? NSVD (normal spontaneous vaginal delivery) 08/11/2020  ? Iron deficiency anemia 11/11/2017  ? UTI (urinary tract infection) 06/10/2017  ? Rubella non-immune status, antepartum 06/09/2017  ? Exposure to [place] virus 06/07/2017  ? ?  ?1. Birth control counseling   ? ?  ? ? ?Plan:  ?  ?       ? 1.  Birth Control ?I discussed multiple birth control options and methods with the patient.  The risks and benefits of each were reviewed. ?We have also discussed permanent sterilization.  All of her questions were answered. ?Once she has decided upon a birth control method she will inform [place] and we will arrange for that method. ?Orders ?No orders of the defined types were placed in this encounter. ? ? No  orders of the defined types were placed in this encounter. ?  ?  F/U ? No follow-ups on file. ?I spent 30 minutes involved in the care of this patient preparing to see the patient by obtaining and reviewing her medical history (including labs, imaging tests and prior procedures), documenting clinical information in the electronic health record (EHR), counseling and coordinating care plans, writing and sending  prescriptions, ordering tests or procedures and in direct communicating with the patient and medical staff discussing pertinent items from her history and physical exam. ? ?Finis Bud, M.D. ?09/10/2021 ?2:08 PM ? ? ? ? ?

## 2021-10-08 DIAGNOSIS — N390 Urinary tract infection, site not specified: Secondary | ICD-10-CM | POA: Diagnosis not present

## 2021-10-16 ENCOUNTER — Encounter: Payer: Self-pay | Admitting: Oncology

## 2022-04-02 ENCOUNTER — Ambulatory Visit: Payer: Medicaid Other | Admitting: Obstetrics and Gynecology

## 2022-06-01 NOTE — L&D Delivery Note (Signed)
Delivery Note   Madison Nichols is a 22 y.o. G3P3003 at [redacted]w[redacted]d Estimated Date of Delivery: 04/29/23  PRE-OPERATIVE DIAGNOSIS:  1) [redacted]w[redacted]d pregnancy.    POST-OPERATIVE DIAGNOSIS:  1) [redacted]w[redacted]d pregnancy s/p Vaginal, Spontaneous   Delivery Type: Vaginal, Spontaneous   Delivery Anesthesia: None  Labor Complications: None    ESTIMATED BLOOD LOSS: 400 ml    FINDINGS:   1) female infant, "Eliana," Apgar scores of 8   at 1 minute and 9   at 5 minutes  Birthweight pending.  SPECIMENS:   PLACENTA:   Appearance: Intact. Moderate sized retroplacental clot   Removal: Spontaneous     Disposition: Per protocol  CORD BLOOD: Collected  DISPOSITION:  Infant left in stable condition in the delivery room, with L&D personnel and mother.  NARRATIVE SUMMARY: Labor course:  Madison Nichols is a [id number] at [redacted]w[redacted]d who presented to Labor & Delivery for SROM and contractions. Her initial cervical exam was 7/80/-2. Labor proceeded spontaneously, and she was found to be completely dilated at 0528. With excellent maternal pushing effort, she birthed a viable felmale infant at 0544. There was not a nuchal cord. The shoulders were birthed without difficulty. The infant was placed skin-to-skin with [facility]. The cord was doubly clamped and cut by the father when pulsations ceased. IV Pitocin was started after the birth of the infant. The placenta delivered spontaneously and was intact with a 3VC. Brisk bleeding was noted immediately after the delivery of the placenta. Vigorous fundal massage was performed and Madison Nichols was given methergine IM. The bleeding resolved.  A perineal and vaginal examination was performed. Mild cervical prolapse was noted. Episiotomy/Lacerations: 1st degree;Perineal. Shallow, hemostatic, not repaired Mother and baby were left in stable condition.   Madison Nichols, CNM 04/18/2023 6:25 AM

## 2022-08-17 ENCOUNTER — Encounter: Payer: Self-pay | Admitting: Oncology

## 2022-08-17 ENCOUNTER — Ambulatory Visit (INDEPENDENT_AMBULATORY_CARE_PROVIDER_SITE_OTHER): Payer: BLUE CROSS/BLUE SHIELD

## 2022-08-17 VITALS — BP 116/60 | HR 91 | Ht 59.0 in | Wt 112.0 lb

## 2022-08-17 DIAGNOSIS — Z3201 Encounter for pregnancy test, result positive: Secondary | ICD-10-CM

## 2022-08-17 DIAGNOSIS — Z3200 Encounter for pregnancy test, result unknown: Secondary | ICD-10-CM

## 2022-08-17 LAB — POCT URINE PREGNANCY: Preg Test, Ur: POSITIVE — AB

## 2022-08-17 NOTE — Progress Notes (Signed)
    NURSE VISIT NOTE  Subjective:    Patient ID: Madison Nichols, female    DOB: 2000-06-24, 22 y.o.   MRN: [id number]  HPI  Patient is a 22 y.o. G65P2002 female who presents for evaluation of amenorrhea. She believes she could be pregnant. Pregnancy is desired. Current symptoms also include: breast tenderness and positive home pregnancy test. Last period was normal.    Objective:    There were no vitals taken for this visit.  Lab Review  No results found for any visits on 08/17/22.  Assessment:   1. Possible pregnancy, not confirmed     Plan:   Pregnancy Test: Positive     Landis Gandy, CMA

## 2022-08-20 ENCOUNTER — Ambulatory Visit: Payer: BLUE CROSS/BLUE SHIELD

## 2022-09-01 ENCOUNTER — Ambulatory Visit (INDEPENDENT_AMBULATORY_CARE_PROVIDER_SITE_OTHER): Payer: BLUE CROSS/BLUE SHIELD

## 2022-09-01 VITALS — BP 104/69 | HR 83 | Wt 109.7 lb

## 2022-09-01 DIAGNOSIS — R399 Unspecified symptoms and signs involving the genitourinary system: Secondary | ICD-10-CM | POA: Diagnosis not present

## 2022-09-01 LAB — POCT URINALYSIS DIPSTICK
Bilirubin, UA: NEGATIVE
Glucose, UA: NEGATIVE
Ketones, UA: NEGATIVE
Leukocytes, UA: NEGATIVE
Nitrite, UA: NEGATIVE
Protein, UA: POSITIVE — AB
Spec Grav, UA: >=1.03 — AB (ref 1.010–1.025)
Urobilinogen, UA: 0.2 E.U./dL
pH, UA: 7 (ref 5.0–8.0)

## 2022-09-01 MED ORDER — SULFAMETHOXAZOLE-TRIMETHOPRIM 800-160 MG PO TABS: 1.0000 | ORAL_TABLET | Freq: Two times a day (BID) | ORAL | 1 refills | Status: DC

## 2022-09-01 MED ORDER — NITROFURANTOIN MONOHYD MACRO 100 MG PO CAPS: 100.0000 mg | ORAL_CAPSULE | Freq: Two times a day (BID) | ORAL | 1 refills | Status: DC

## 2022-09-01 NOTE — Addendum Note (Signed)
Addended by: Inis Sizer on: 09/01/2022 03:37 PM   Modules accepted: Orders

## 2022-09-01 NOTE — Progress Notes (Addendum)
    NURSE VISIT NOTE  Subjective:    Patient ID: Madison Nichols, female    DOB: 07/05/2000, 22 y.o.   MRN: [id number]       HPI  Patient is a 22 y.o. G33P2002 female who presents for dysuria for 3 days.  Patient denies urinary frequency.  Patient states she does have a history of recurrent UTI.  Patient does not have a history of pyelonephritis.    Objective:     Plan:   Urine Culture Sent. Also sent in Macrobid 100 mg PO BID for 7 days   Inis Sizer, CMA

## 2022-09-02 ENCOUNTER — Other Ambulatory Visit: Payer: Self-pay

## 2022-09-02 ENCOUNTER — Other Ambulatory Visit: Payer: BLUE CROSS/BLUE SHIELD

## 2022-09-02 DIAGNOSIS — O209 Hemorrhage in early pregnancy, unspecified: Secondary | ICD-10-CM

## 2022-09-03 ENCOUNTER — Telehealth: Payer: Self-pay

## 2022-09-03 LAB — URINE CULTURE

## 2022-09-03 LAB — BETA HCG QUANT (REF LAB): hCG Quant: 83863 m[IU]/mL

## 2022-09-03 NOTE — Telephone Encounter (Signed)
I contacted Madison Nichols to let her know the urine culture came back and she does not have a UTI and she can stop the antibiotic.   Patient understood.

## 2022-09-04 ENCOUNTER — Other Ambulatory Visit: Payer: BLUE CROSS/BLUE SHIELD

## 2022-09-04 DIAGNOSIS — O209 Hemorrhage in early pregnancy, unspecified: Secondary | ICD-10-CM

## 2022-09-05 LAB — BETA HCG QUANT (REF LAB): hCG Quant: 121026 m[IU]/mL

## 2022-09-07 ENCOUNTER — Ambulatory Visit (INDEPENDENT_AMBULATORY_CARE_PROVIDER_SITE_OTHER): Payer: BLUE CROSS/BLUE SHIELD

## 2022-09-07 ENCOUNTER — Other Ambulatory Visit: Payer: Self-pay | Admitting: Obstetrics and Gynecology

## 2022-09-07 ENCOUNTER — Other Ambulatory Visit: Payer: Self-pay

## 2022-09-07 DIAGNOSIS — Z3A01 Less than 8 weeks gestation of pregnancy: Secondary | ICD-10-CM

## 2022-09-07 DIAGNOSIS — O209 Hemorrhage in early pregnancy, unspecified: Secondary | ICD-10-CM

## 2022-09-07 DIAGNOSIS — O3680X0 Pregnancy with inconclusive fetal viability, not applicable or unspecified: Secondary | ICD-10-CM

## 2022-09-10 ENCOUNTER — Telehealth: Payer: Self-pay

## 2022-09-10 ENCOUNTER — Other Ambulatory Visit: Payer: Self-pay

## 2022-09-10 DIAGNOSIS — O219 Vomiting of pregnancy, unspecified: Secondary | ICD-10-CM

## 2022-09-10 MED ORDER — DOXYLAMINE-PYRIDOXINE 10-10 MG PO TBEC: 2.0000 | DELAYED_RELEASE_TABLET | Freq: Every day | ORAL | 0 refills | Status: DC

## 2022-09-10 MED ORDER — PROMETHAZINE HCL 25 MG PO TABS
25.0000 mg | ORAL_TABLET | Freq: Four times a day (QID) | ORAL | 1 refills | Status: DC | PRN
Start: 2022-09-10 — End: 2022-10-09

## 2022-09-10 NOTE — Telephone Encounter (Signed)
Pt no longer needs this medicaiton

## 2022-09-10 NOTE — Telephone Encounter (Signed)
Patient contacted office requesting prescription for Zofran sent to [facility] pharmacy, patient states that she has tried otc ginger and nausea medication with no relief. KW

## 2022-09-10 NOTE — Progress Notes (Signed)
Nausea and vomiting during pregnancy

## 2022-09-10 NOTE — Telephone Encounter (Signed)
Request deleted in Cover my Meds

## 2022-09-15 ENCOUNTER — Other Ambulatory Visit: Payer: Self-pay

## 2022-09-15 DIAGNOSIS — O219 Vomiting of pregnancy, unspecified: Secondary | ICD-10-CM

## 2022-09-15 MED ORDER — ONDANSETRON HCL 4 MG PO TABS: 4.0000 mg | ORAL_TABLET | Freq: Three times a day (TID) | ORAL | 0 refills | Status: DC | PRN

## 2022-09-25 ENCOUNTER — Ambulatory Visit: Payer: BLUE CROSS/BLUE SHIELD

## 2022-09-28 ENCOUNTER — Ambulatory Visit (INDEPENDENT_AMBULATORY_CARE_PROVIDER_SITE_OTHER): Payer: BLUE CROSS/BLUE SHIELD

## 2022-09-28 VITALS — Wt 109.0 lb

## 2022-09-28 DIAGNOSIS — Z3480 Encounter for supervision of other normal pregnancy, unspecified trimester: Secondary | ICD-10-CM

## 2022-09-28 DIAGNOSIS — Z3689 Encounter for other specified antenatal screening: Secondary | ICD-10-CM

## 2022-09-28 DIAGNOSIS — Z369 Encounter for antenatal screening, unspecified: Secondary | ICD-10-CM

## 2022-09-28 DIAGNOSIS — Z130 Encounter for screening for diseases of the blood and blood-forming organs and certain disorders involving the immune mechanism: Secondary | ICD-10-CM

## 2022-09-28 NOTE — Progress Notes (Signed)
New OB Intake  I connected with  Ofilia Neas on 09/28/22 at  2:15 PM EDT by telephone and verified that I am speaking with the correct person using two identifiers. Nurse is located at [facility] and pt is located at home.  I explained I am completing New OB Intake today. We discussed her EDD of 04/24/2023 that is based on LMP of 07/18/2022. Pt is G3/P2002. I reviewed her allergies, medications, Medical/Surgical/OB history, and appropriate screenings. There are no cats in the home.  Based on history, this is a/an pregnancy uncomplicated .   Patient Active Problem List   Diagnosis Date Noted   Supervision of other normal pregnancy, antepartum 09/28/2022   NSVD (normal spontaneous vaginal delivery) 08/11/2020   Iron deficiency anemia 11/11/2017   UTI (urinary tract infection) 06/10/2017   Rubella non-immune status, antepartum 06/09/2017   Exposure to [place] virus 06/07/2017    Concerns addressed today None  Delivery Plans:  Plans to deliver at [facility].  Anatomy US Explained first scheduled [place] will be 5/10th and an anatomy scan will be done at 20 weeks.  Labs Discussed genetic screening with patient. Patient desires genetic testing to be drawn with new OB lab. Discussed possible labs to be drawn at new OB appointment.  COVID Vaccine Patient has had COVID vaccine.   Social Determinants of Health Food Insecurity: denies food insecurity Transportation: Patient denies transportation needs. Childcare: Discussed no children allowed at ultrasound appointments.   First visit review I reviewed new OB appt with pt. I explained she will have ob bloodwork and pap smear/pelvic exam if indicated. Explained pt will be seen by Dr. Brennan Bailey at first visit; encounter routed to appropriate provider.   Loran Senters, [facility] 09/28/2022  2:39 PM

## 2022-09-28 NOTE — Patient Instructions (Signed)
Second Trimester of Pregnancy  The second trimester of pregnancy is from week 13 through week 27. This is months 4 through 6 of pregnancy. The second trimester is often a time when you feel your best. Your body has adjusted to being pregnant, and you begin to feel better physically. During the second trimester: Morning sickness has lessened or stopped completely. You may have more energy. You may have an increase in appetite. The second trimester is also a time when the unborn baby (fetus) is growing rapidly. At the end of the sixth month, the fetus may be up to 12 inches long and weigh about 1 pounds. You will likely begin to feel the baby move (quickening) between 16 and 20 weeks of pregnancy. Body changes during your second trimester Your body continues to go through many changes during your second trimester. The changes vary and generally return to normal after the baby is born. Physical changes Your weight will continue to increase. You will notice your lower abdomen bulging out. You may begin to get stretch marks on your hips, abdomen, and breasts. Your breasts will continue to grow and to become tender. Dark spots or blotches (chloasma or mask of pregnancy) may develop on your face. A dark line from your belly button to the pubic area (linea nigra) may appear. You may have changes in your hair. These can include thickening of your hair, rapid growth, and changes in texture. Some people also have hair loss during or after pregnancy, or hair that feels dry or thin. Health changes You may develop headaches. You may have heartburn. You may develop constipation. You may develop hemorrhoids or swollen, bulging veins (varicose veins). Your gums may bleed and may be sensitive to brushing and flossing. You may urinate more often because the fetus is pressing on your bladder. You may have back pain. This is caused by: Weight gain. Pregnancy hormones that are relaxing the joints in your  pelvis. A shift in weight and the muscles that support your balance. Follow these instructions at home: Medicines Follow your health care provider's instructions regarding medicine use. Specific medicines may be either safe or unsafe to take during pregnancy. Do not take any medicines unless approved by your health care provider. Take a prenatal vitamin that contains at least 600 micrograms (mcg) of folic acid. Eating and drinking Eat a healthy diet that includes fresh fruits and vegetables, whole grains, good sources of protein such as meat, eggs, or tofu, and low-fat dairy products. Avoid raw meat and unpasteurized juice, milk, and cheese. These carry germs that can harm you and your baby. You may need to take these actions to prevent or treat constipation: Drink enough fluid to keep your urine pale yellow. Eat foods that are high in fiber, such as beans, whole grains, and fresh fruits and vegetables. Limit foods that are high in fat and processed sugars, such as fried or sweet foods. Activity Exercise only as directed by your health care provider. Most people can continue their usual exercise routine during pregnancy. Try to exercise for 30 minutes at least 5 days a week. Stop exercising if you develop contractions in your uterus. Stop exercising if you develop pain or cramping in the lower abdomen or lower back. Avoid exercising if it is very hot or humid or if you are at a high altitude. Avoid heavy lifting. If you choose to, you may have sex unless your health care provider tells you not to. Relieving pain and discomfort Wear a supportive   bra to prevent discomfort from breast tenderness. Take warm sitz baths to soothe any pain or discomfort caused by hemorrhoids. Use hemorrhoid cream if your health care provider approves. Rest with your legs raised (elevated) if you have leg cramps or low back pain. If you develop varicose veins: Wear support hose as told by your health care  provider. Elevate your feet for 15 minutes, 3-4 times a day. Limit salt in your diet. Safety Wear your seat belt at all times when driving or riding in a car. Talk with your health care provider if someone is verbally or physically abusive to you. Lifestyle Do not use hot tubs, steam rooms, or saunas. Do not douche. Do not use tampons or scented sanitary pads. Avoid cat litter boxes and soil used by cats. These carry germs that can cause birth defects in the baby and possibly loss of the fetus by miscarriage or stillbirth. Do not use herbal remedies, alcohol, illegal drugs, or medicines that are not approved by your health care provider. Chemicals in these products can harm your baby. Do not use any products that contain nicotine or tobacco, such as cigarettes, e-cigarettes, and chewing tobacco. If you need help quitting, ask your health care provider. General instructions During a routine prenatal visit, your health care provider will do a physical exam and other tests. He or she will also discuss your overall health. Keep all follow-up visits. This is important. Ask your health care provider for a referral to a local prenatal education class. Ask for help if you have counseling or nutritional needs during pregnancy. Your health care provider can offer advice or refer you to specialists for help with various needs. Where to find more information American Pregnancy Association: americanpregnancy.org American College of Obstetricians and Gynecologists: acog.org/en/Womens%20Health/Pregnancy Office on Women's Health: womenshealth.gov/pregnancy Contact a health care provider if you have: A headache that does not go away when you take medicine. Vision changes or you see spots in front of your eyes. Mild pelvic cramps, pelvic pressure, or nagging pain in the abdominal area. Persistent nausea, vomiting, or diarrhea. A bad-smelling vaginal discharge or foul-smelling urine. Pain when you  urinate. Sudden or extreme swelling of your face, hands, ankles, feet, or legs. A fever. Get help right away if you: Have fluid leaking from your vagina. Have spotting or bleeding from your vagina. Have severe abdominal cramping or pain. Have difficulty breathing. Have chest pain. Have fainting spells. Have not felt your baby move for the time period told by your health care provider. Have new or increased pain, swelling, or redness in an arm or leg. Summary The second trimester of pregnancy is from week 13 through week 27 (months 4 through 6). Do not use herbal remedies, alcohol, illegal drugs, or medicines that are not approved by your health care provider. Chemicals in these products can harm your baby. Exercise only as directed by your health care provider. Most people can continue their usual exercise routine during pregnancy. Keep all follow-up visits. This is important. This information is not intended to replace advice given to you by your health care provider. Make sure you discuss any questions you have with your health care provider. Document Revised: 10/25/2019 Document Reviewed: 08/31/2019 Elsevier Patient Education  2023 [employer] First Trimester of Pregnancy  The first trimester of pregnancy starts on the first day of your last menstrual period until the end of week 12. This is also called months 1 through 3 of pregnancy. Body changes during your first trimester Your   body goes through many changes during pregnancy. The changes usually return to normal after your baby is born. Physical changes You may gain or lose weight. Your breasts may grow larger and hurt. The area around your nipples may get darker. Dark spots or blotches may develop on your face. You may have changes in your hair. Health changes You may feel like you might vomit (nauseous), and you may vomit. You may have heartburn. You may have headaches. You may have trouble pooping (constipation). Your  gums may bleed. Other changes You may get tired easily. You may pee (urinate) more often. Your menstrual periods will stop. You may not feel hungry. You may want to eat certain kinds of food. You may have changes in your emotions from day to day. You may have more dreams. Follow these instructions at home: Medicines Take over-the-counter and prescription medicines only as told by your doctor. Some medicines are not safe during pregnancy. Take a prenatal vitamin that contains at least 600 micrograms (mcg) of folic acid. Eating and drinking Eat healthy meals that include: Fresh fruits and vegetables. Whole grains. Good sources of protein, such as meat, eggs, or tofu. Low-fat dairy products. Avoid raw meat and unpasteurized juice, milk, and cheese. If you feel like you may vomit, or you vomit: Eat 4 or 5 small meals a day instead of 3 large meals. Try eating a few soda crackers. Drink liquids between meals instead of during meals. You may need to take these actions to prevent or treat trouble pooping: Drink enough fluids to keep your pee (urine) pale yellow. Eat foods that are high in fiber. These include beans, whole grains, and fresh fruits and vegetables. Limit foods that are high in fat and sugar. These include fried or sweet foods. Activity Exercise only as told by your doctor. Most people can do their usual exercise routine during pregnancy. Stop exercising if you have cramps or pain in your lower belly (abdomen) or low back. Do not exercise if it is too hot or too humid, or if you are in a place of great height (high altitude). Avoid heavy lifting. If you choose to, you may have sex unless your doctor tells you not to. Relieving pain and discomfort Wear a good support bra if your breasts are sore. Rest with your legs raised (elevated) if you have leg cramps or low back pain. If you have bulging veins (varicose veins) in your legs: Wear support hose as told by your  doctor. Raise your feet for 15 minutes, 3-4 times a day. Limit salt in your food. Safety Wear your seat belt at all times when you are in a car. Talk with your doctor if someone is hurting you or yelling at you. Talk with your doctor if you are feeling sad or have thoughts of hurting yourself. Lifestyle Do not use hot tubs, steam rooms, or saunas. Do not douche. Do not use tampons or scented sanitary pads. Do not use herbal medicines, illegal drugs, or medicines that are not approved by your doctor. Do not drink alcohol. Do not smoke or use any products that contain nicotine or tobacco. If you need help quitting, ask your doctor. Avoid cat litter boxes and soil that is used by cats. These carry germs that can cause harm to the baby and can cause a loss of your baby by miscarriage or stillbirth. General instructions Keep all follow-up visits. This is important. Ask for help if you need counseling or if you need help with   nutrition. Your doctor can give you advice or tell you where to go for help. Visit your dentist. At home, brush your teeth with a soft toothbrush. Floss gently. Write down your questions. Take them to your prenatal visits. Where to find more information American Pregnancy Association: americanpregnancy.org American College of Obstetricians and Gynecologists: www.acog.org Office on Women's Health: womenshealth.gov/pregnancy Contact a doctor if: You are dizzy. You have a fever. You have mild cramps or pressure in your lower belly. You have a nagging pain in your belly area. You continue to feel like you may vomit, you vomit, or you have watery poop (diarrhea) for 24 hours or longer. You have a bad-smelling fluid coming from your vagina. You have pain when you pee. You are exposed to a disease that spreads from person to person, such as chickenpox, measles, Zika virus, HIV, or hepatitis. Get help right away if: You have spotting or bleeding from your vagina. You have  very bad belly cramping or pain. You have shortness of breath or chest pain. You have any kind of injury, such as from a fall or a car crash. You have new or increased pain, swelling, or redness in an arm or leg. Summary The first trimester of pregnancy starts on the first day of your last menstrual period until the end of week 12 (months 1 through 3). Eat 4 or 5 small meals a day instead of 3 large meals. Do not smoke or use any products that contain nicotine or tobacco. If you need help quitting, ask your doctor. Keep all follow-up visits. This information is not intended to replace advice given to you by your health care provider. Make sure you discuss any questions you have with your health care provider. Document Revised: 10/25/2019 Document Reviewed: 08/31/2019 Elsevier Patient Education  2023 [employer] Commonly Asked Questions During Pregnancy  Cats: A parasite can be excreted in cat feces.  To avoid exposure you need to have another person empty the little box.  If you must empty the litter box you will need to wear gloves.  Wash your hands after handling your cat.  This parasite can also be found in raw or undercooked meat so this should also be avoided.  Colds, Sore Throats, Flu: Please check your medication sheet to see what you can take for symptoms.  If your symptoms are unrelieved by these medications please call the office.  Dental Work: Most any dental work your dentist recommends is permitted.  X-rays should only be taken during the first trimester if absolutely necessary.  Your abdomen should be shielded with a lead apron during all x-rays.  Please notify your provider prior to receiving any x-rays.  Novocaine is fine; gas is not recommended.  If your dentist requires a note from us prior to dental work please call the office and we will provide one for you.  Exercise: Exercise is an important part of staying healthy during your pregnancy.  You may continue most exercises  you were accustomed to prior to pregnancy.  Later in your pregnancy you will most likely notice you have difficulty with activities requiring balance like riding a bicycle.  It is important that you listen to your body and avoid activities that put you at a higher risk of falling.  Adequate rest and staying well hydrated are a must!  If you have questions about the safety of specific activities ask your provider.    Exposure to Children with illness: Try to avoid obvious exposure; report any   symptoms to us when noted,  If you have chicken pos, red measles or mumps, you should be immune to these diseases.   Please do not take any vaccines while pregnant unless you have checked with your OB provider.  Fetal Movement: After 28 weeks we recommend you do "kick counts" twice daily.  Lie or sit down in a calm quiet environment and count your baby movements "kicks".  You should feel your baby at least 10 times per hour.  If you have not felt 10 kicks within the first hour get up, walk around and have something sweet to eat or drink then repeat for an additional hour.  If count remains less than 10 per hour notify your provider.  Fumigating: Follow your pest control agent's advice as to how long to stay out of your home.  Ventilate the area well before re-entering.  Hemorrhoids:   Most over-the-counter preparations can be used during pregnancy.  Check your medication to see what is safe to use.  It is important to use a stool softener or fiber in your diet and to drink lots of liquids.  If hemorrhoids seem to be getting worse please call the office.   Hot Tubs:  Hot tubs Jacuzzis and saunas are not recommended while pregnant.  These increase your internal body temperature and should be avoided.  Intercourse:  Sexual intercourse is safe during pregnancy as long as you are comfortable, unless otherwise advised by your provider.  Spotting may occur after intercourse; report any bright red bleeding that is heavier  than spotting.  Labor:  If you know that you are in labor, please go to the hospital.  If you are unsure, please call the office and let us help you decide what to do.  Lifting, straining, etc:  If your job requires heavy lifting or straining please check with your provider for any limitations.  Generally, you should not lift items heavier than that you can lift simply with your hands and arms (no back muscles)  Painting:  Paint fumes do not harm your pregnancy, but may make you ill and should be avoided if possible.  Latex or water based paints have less odor than oils.  Use adequate ventilation while painting.  Permanents & Hair Color:  Chemicals in hair dyes are not recommended as they cause increase hair dryness which can increase hair loss during pregnancy.  " Highlighting" and permanents are allowed.  Dye may be absorbed differently and permanents may not hold as well during pregnancy.  Sunbathing:  Use a sunscreen, as skin burns easily during pregnancy.  Drink plenty of fluids; avoid over heating.  Tanning Beds:  Because their possible side effects are still unknown, tanning beds are not recommended.  Ultrasound Scans:  Routine ultrasounds are performed at approximately 20 weeks.  You will be able to see your baby's general anatomy an if you would like to know the gender this can usually be determined as well.  If it is questionable when you conceived you may also receive an ultrasound early in your pregnancy for dating purposes.  Otherwise ultrasound exams are not routinely performed unless there is a medical necessity.  Although you can request a scan we ask that you pay for it when conducted because insurance does not cover " patient request" scans.  Work: If your pregnancy proceeds without complications you may work until your due date, unless your physician or employer advises otherwise.  Round Ligament Pain/Pelvic Discomfort:  Sharp, shooting pains not associated   with bleeding are  fairly common, usually occurring in the second trimester of pregnancy.  They tend to be worse when standing up or when you remain standing for long periods of time.  These are the result of pressure of certain pelvic ligaments called "round ligaments".  Rest, Tylenol and heat seem to be the most effective relief.  As the womb and fetus grow, they rise out of the pelvis and the discomfort improves.  Please notify the office if your pain seems different than that described.  It may represent a more serious condition.  Common Medications Safe in Pregnancy  Acne:      Constipation:  Benzoyl Peroxide     Colace  Clindamycin      Dulcolax Suppository  Topica Erythromycin     Fibercon  Salicylic Acid      Metamucil         Miralax AVOID:        Senakot   Accutane    Cough:  Retin-A       Cough Drops  Tetracycline      Phenergan w/ Codeine if Rx  Minocycline      Robitussin (Plain & DM)  Antibiotics:     Crabs/Lice:  Ceclor       RID  Cephalosporins    AVOID:  E-Mycins      Kwell  Keflex  Macrobid/Macrodantin   Diarrhea:  Penicillin      Kao-Pectate  Zithromax      Imodium AD         PUSH FLUIDS AVOID:       Cipro     Fever:  Tetracycline      Tylenol (Regular or Extra  Minocycline       Strength)  Levaquin      Extra Strength-Do not          Exceed 8 tabs/24 hrs Caffeine:        <200mg/day (equiv. To 1 cup of coffee or  approx. 3 12 oz sodas)         Gas: Cold/Hayfever:       Gas-X  Benadryl      Mylicon  Claritin       Phazyme  **Claritin-D        Chlor-Trimeton    Headaches:  Dimetapp      ASA-Free Excedrin  Drixoral-Non-Drowsy     Cold Compress  Mucinex (Guaifenasin)     Tylenol (Regular or Extra  Sudafed/Sudafed-12 Hour     Strength)  **Sudafed PE Pseudoephedrine   Tylenol Cold & Sinus     Vicks Vapor Rub  Zyrtec  **AVOID if Problems With Blood Pressure         Heartburn: Avoid lying down for at least 1 hour after meals  Aciphex      Maalox     Rash:  Milk of  Magnesia     Benadryl    Mylanta       1% Hydrocortisone Cream  Pepcid  Pepcid Complete   Sleep Aids:  Prevacid      Ambien   Prilosec       Benadryl  Rolaids       Chamomile Tea  Tums (Limit 4/day)     Unisom         Tylenol PM         Warm milk-add vanilla or  Hemorrhoids:       Sugar for taste  Anusol/Anusol H.C.  (RX: Analapram 2.5%)  Sugar Substitutes:    Hydrocortisone OTC     Ok in moderation  Preparation H      Tucks        Vaseline lotion applied to tissue with wiping    Herpes:     Throat:  Acyclovir      Oragel  Famvir  Valtrex     Vaccines:         Flu Shot Leg Cramps:       *Gardasil  Benadryl      Hepatitis A         Hepatitis B Nasal Spray:       Pneumovax  Saline Nasal Spray     Polio Booster         Tetanus Nausea:       Tuberculosis test or PPD  Vitamin B6 25 mg TID   AVOID:    Dramamine      *Gardasil  Emetrol       Live Poliovirus  Ginger Root 250 mg QID    MMR (measles, mumps &  High Complex Carbs @ Bedtime    rebella)  Sea Bands-Accupressure    Varicella (Chickenpox)  Unisom 1/2 tab TID     *No known complications           If received before Pain:         Known pregnancy;   Darvocet       Resume series after  Lortab        Delivery  Percocet    Yeast:   Tramadol      Femstat  Tylenol 3      Gyne-lotrimin  Ultram       Monistat  Vicodin           MISC:         All Sunscreens           Hair Coloring/highlights          Insect Repellant's          (Including DEET)         Mystic Tans  

## 2022-10-09 ENCOUNTER — Other Ambulatory Visit: Payer: BLUE CROSS/BLUE SHIELD

## 2022-10-09 ENCOUNTER — Other Ambulatory Visit (HOSPITAL_COMMUNITY)
Admission: RE | Admit: 2022-10-09 | Discharge: 2022-10-09 | Disposition: A | Discharge status: Home / Self Care | Payer: BLUE CROSS/BLUE SHIELD | Source: Ambulatory Visit | Attending: Obstetrics and Gynecology | Admitting: Obstetrics and Gynecology

## 2022-10-09 ENCOUNTER — Ambulatory Visit (INDEPENDENT_AMBULATORY_CARE_PROVIDER_SITE_OTHER): Payer: BLUE CROSS/BLUE SHIELD

## 2022-10-09 VITALS — BP 99/70 | HR 118 | Ht 59.0 in

## 2022-10-09 DIAGNOSIS — Z369 Encounter for antenatal screening, unspecified: Secondary | ICD-10-CM | POA: Insufficient documentation

## 2022-10-09 DIAGNOSIS — Z3480 Encounter for supervision of other normal pregnancy, unspecified trimester: Secondary | ICD-10-CM | POA: Insufficient documentation

## 2022-10-09 DIAGNOSIS — Z130 Encounter for screening for diseases of the blood and blood-forming organs and certain disorders involving the immune mechanism: Secondary | ICD-10-CM

## 2022-10-09 DIAGNOSIS — R300 Dysuria: Secondary | ICD-10-CM

## 2022-10-09 LAB — POCT URINALYSIS DIPSTICK OB
Bilirubin, UA: NEGATIVE
Leukocytes, UA: NEGATIVE
Nitrite, UA: NEGATIVE
Spec Grav, UA: 1.015 (ref 1.010–1.025)
Urobilinogen, UA: 0.2 E.U./dL
pH, UA: 6.5 (ref 5.0–8.0)

## 2022-10-09 LAB — OB RESULTS CONSOLE VARICELLA ZOSTER ANTIBODY, IGG: Varicella: NON-IMMUNE/NOT IMMUNE

## 2022-10-09 MED ORDER — NITROFURANTOIN MONOHYD MACRO 100 MG PO CAPS
100.0000 mg | ORAL_CAPSULE | Freq: Two times a day (BID) | ORAL | 0 refills | Status: DC
Start: 2022-10-09 — End: 2022-10-21

## 2022-10-09 NOTE — Patient Instructions (Signed)
Pregnancy and Urinary Tract Infection ? ?A urinary tract infection (UTI) is an infection of any part of the urinary tract. This includes the kidneys, the tubes that connect the kidneys to the bladder (ureters), the bladder, and the tube that carries urine out of the body (urethra). These organs make, store, and get rid of urine in the body. Your health care provider may use other names to describe the infection. An upper UTI affects the ureters and kidneys (pyelonephritis). A lower UTI affects the bladder (cystitis) and urethra (urethritis). ?Most UTIs are caused by bacteria in the genital area, around the entrance to the urinary tract. These bacteria grow and cause irritation and inflammation of the urinary tract. You are more likely to develop a UTI during pregnancy because: ?The physical and hormonal changes that your body goes through make it easier for bacteria to get into your urinary tract. ?Your growing baby puts pressure on your bladder and can affect urine flow. ?Pregnant women with diabetes are at an increased risk for developing a UTI. It is important to recognize and treat UTIs in pregnancy because they can cause serious complications for both you and your baby. ?How does this affect me? ?Symptoms of a UTI include: ?Needing to urinate right away (urgently) and often, even if urinating a small amount. ?Pain, burning, or having a hard time passing urine. ?Blood in the urine. ?Unusual, cloudy, and bad-smelling urine. ?Pain in the abdomen or lower back. ?Vaginal discharge. ?You may also have: ?Vomiting or a decreased appetite. ?Confusion. ?Irritability or tiredness. ?A fever. ?Diarrhea. ?A low level of red blood cells (anemia). ?The development of high blood pressure during pregnancy (preeclampsia). ?How does this affect my baby? ?An untreated UTI during pregnancy could lead to a kidney infection or an infection throughout the mother's body (systemic infection). This can cause health problems and affect the  baby. Possible complications of an untreated UTI include: ?Your baby being born before 37 weeks of pregnancy (premature). ?Your baby being born with a low birth weight. ?Your baby having a higher risk of having his or her skin or the white parts of the eyes turn yellow (jaundice). ?What can I do to lower my risk? ?To prevent a UTI: ?Do not hold urine for long periods of time. Empty your bladder as soon as you feel the urge. ?Always wipe from front to back, especially after a bowel movement. Use each tissue one time when you wipe. ?Empty your bladder after sex. ?Keep your genital area dry. ?Drink 6 to 8 glasses of water each day. ?Do not douche or use deodorant sprays. ?Wear cotton underwear and loose clothing. ?How is this treated? ?Treatment for this condition may include: ?Antibiotic medicines that are safe to take during pregnancy. ?Other medicines to treat less common causes of UTI. ?Follow these instructions at home: ?If you were prescribed an antibiotic medicine, take it as told by your health care provider. Do not stop using the antibiotic even if you start to feel better. ?Keep all follow-up visits. This is important. ?Contact a health care provider if: ?Your symptoms do not improve or they get worse. ?You have abnormal vaginal discharge. ?Get help right away if you: ?Have a fever. ?Have nausea and vomiting. ?Have back or side pain. ?Have lower belly pain, tightness, or feel contractions in your uterus. ?Have a gush of fluid from your vagina. ?Have blood in your urine. ?Summary ?A UTI is an infection of any part of the urinary tract, which includes the kidneys, ureters, bladder,   and urethra. ?Most urinary tract infections are caused by bacteria in your genital area, around the entrance to your urinary tract (urethra). ?You are more likely to develop a UTI during pregnancy. It is important to recognize and treat UTIs in pregnancy because of the risk of serious complications for both you and your baby. ?If you  were prescribed an antibiotic medicine, take it as told by your health care provider. Do not stop using the antibiotic even if you start to feel better. ?This information is not intended to replace advice given to you by your health care provider. Make sure you discuss any questions you have with your health care provider. ?Document Revised: 01/01/2021 Document Reviewed: 01/01/2021 ?Elsevier Patient Education ? 2023 Elsevier Inc[employer] ? ?

## 2022-10-09 NOTE — Progress Notes (Signed)
    NURSE VISIT NOTE  Subjective:    Patient ID: Madison Nichols, female    DOB: 2000-10-22, 22 y.o.   MRN: [id number]       HPI  Patient is a 22 y.o. G36P2002 female who presents for urinary frequency, abdominal pain, and painful urination  for 2 days.  Patient does have a history of recurrent UTI.  Patient does have a history of pyelonephritis.    Objective:    BP 99/70   Pulse (!) 118   Ht 4\' 11"  (1.499 m)   LMP 07/18/2022 (Exact Date)   BMI 22.02 kg/m    Lab Review  Results for orders placed or performed in visit on 10/09/22  POC Urinalysis Dipstick OB  Result Value Ref Range   Color, UA     Clarity, UA     Glucose, UA Moderate (2+) (A) Negative   Bilirubin, UA neg    Ketones, UA 3+    Spec Grav, UA 1.015 1.010 - 1.025   Blood, UA trace    pH, UA 6.5 5.0 - 8.0   POC,PROTEIN,UA Moderate (2+) Negative, Trace, Small (1+), Moderate (2+), Large (3+), 4+   Urobilinogen, UA 0.2 0.2 or 1.0 E.U./dL   Nitrite, UA neg    Leukocytes, UA Negative Negative   Appearance     Odor      Assessment:   1. Burning with urination      Plan:   Urine Culture Sent. Treatment  Macrobid 100 mg PO BID for 7 days.

## 2022-10-10 LAB — URINALYSIS, ROUTINE W REFLEX MICROSCOPIC
Bilirubin, UA: NEGATIVE
Nitrite, UA: NEGATIVE
RBC, UA: NEGATIVE
Specific Gravity, UA: 1.023 (ref 1.005–1.030)
Urobilinogen, Ur: 1 mg/dL (ref 0.2–1.0)
pH, UA: 7.5 (ref 5.0–7.5)

## 2022-10-10 LAB — MICROSCOPIC EXAMINATION
Casts: NONE SEEN /lpf
Epithelial Cells (non renal): >10 /hpf — AB (ref 0–10)
RBC, Urine: NONE SEEN /hpf (ref 0–2)

## 2022-10-13 LAB — MONITOR DRUG PROFILE 14(MW)
Amphetamine Scrn, Ur: NEGATIVE ng/mL
BARBITURATE SCREEN URINE: NEGATIVE ng/mL
BENZODIAZEPINE SCREEN, URINE: NEGATIVE ng/mL
Buprenorphine, Urine: NEGATIVE ng/mL
CANNABINOIDS UR QL SCN: NEGATIVE ng/mL
Cocaine (Metab) Scrn, Ur: NEGATIVE ng/mL
Creatinine(Crt), U: 87.5 mg/dL (ref 20.0–300.0)
Fentanyl, Urine: NEGATIVE pg/mL
Meperidine Screen, Urine: NEGATIVE ng/mL
Methadone Screen, Urine: NEGATIVE ng/mL
OXYCODONE+OXYMORPHONE UR QL SCN: NEGATIVE ng/mL
Opiate Scrn, Ur: NEGATIVE ng/mL
Ph of Urine: 5.9 (ref 4.5–8.9)
Phencyclidine Qn, Ur: NEGATIVE ng/mL
Propoxyphene Scrn, Ur: NEGATIVE ng/mL
SPECIFIC GRAVITY: 1.017
Tramadol Screen, Urine: NEGATIVE ng/mL

## 2022-10-13 LAB — NICOTINE SCREEN, URINE: Cotinine Ql Scrn, Ur: NEGATIVE ng/mL

## 2022-10-13 LAB — URINE CYTOLOGY ANCILLARY ONLY
Chlamydia: NEGATIVE
Comment: NEGATIVE
Comment: NORMAL
Neisseria Gonorrhea: NEGATIVE

## 2022-10-14 LAB — URINE CULTURE, OB REFLEX

## 2022-10-14 LAB — CULTURE, OB URINE

## 2022-10-15 LAB — CBC/D/PLT+RPR+RH+ABO+RUBIGG...
Antibody Screen: NEGATIVE
Basophils Absolute: 0 10*3/uL (ref 0.0–0.2)
Basos: 0 %
EOS (ABSOLUTE): 0 10*3/uL (ref 0.0–0.4)
Eos: 0 %
HCV Ab: NONREACTIVE
HIV Screen 4th Generation wRfx: NONREACTIVE
Hematocrit: 38 % (ref 34.0–46.6)
Hemoglobin: 13 g/dL (ref 11.1–15.9)
Hepatitis B Surface Ag: NEGATIVE
Immature Grans (Abs): 0 10*3/uL (ref 0.0–0.1)
Immature Granulocytes: 0 %
Lymphocytes Absolute: 1.3 10*3/uL (ref 0.7–3.1)
Lymphs: 20 %
MCH: 29.9 pg (ref 26.6–33.0)
MCHC: 34.2 g/dL (ref 31.5–35.7)
MCV: 87 fL (ref 79–97)
Monocytes Absolute: 0.3 10*3/uL (ref 0.1–0.9)
Monocytes: 5 %
Neutrophils Absolute: 4.8 10*3/uL (ref 1.4–7.0)
Neutrophils: 75 %
Platelets: 199 10*3/uL (ref 150–450)
RBC: 4.35 x10E6/uL (ref 3.77–5.28)
RDW: 12.7 % (ref 11.7–15.4)
RPR Ser Ql: NONREACTIVE
Rh Factor: POSITIVE
Rubella Antibodies, IGG: <0.9 index — ABNORMAL LOW (ref >0.99)
Varicella zoster IgG: 321 index (ref >165)
WBC: 6.4 10*3/uL (ref 3.4–10.8)

## 2022-10-15 LAB — MATERNIT 21 PLUS CORE, BLOOD
Fetal Fraction: 11
Result (T21): NEGATIVE
Trisomy 13 (Patau syndrome): NEGATIVE
Trisomy 18 (Edwards syndrome): NEGATIVE
Trisomy 21 (Down syndrome): NEGATIVE

## 2022-10-15 LAB — HCV INTERPRETATION

## 2022-10-15 LAB — HGB FRACTIONATION CASCADE
Hgb A2: 2.7 % (ref 1.8–3.2)
Hgb A: 97.3 % (ref 96.4–98.8)
Hgb F: 0 % (ref 0.0–2.0)
Hgb S: 0 %

## 2022-10-21 ENCOUNTER — Other Ambulatory Visit (HOSPITAL_COMMUNITY)
Admission: RE | Admit: 2022-10-21 | Discharge: 2022-10-21 | Disposition: A | Discharge status: Home / Self Care | Payer: BLUE CROSS/BLUE SHIELD | Source: Ambulatory Visit | Attending: Obstetrics and Gynecology | Admitting: Obstetrics and Gynecology

## 2022-10-21 ENCOUNTER — Ambulatory Visit (INDEPENDENT_AMBULATORY_CARE_PROVIDER_SITE_OTHER): Payer: BLUE CROSS/BLUE SHIELD | Admitting: Obstetrics and Gynecology

## 2022-10-21 ENCOUNTER — Encounter: Payer: Self-pay | Admitting: Obstetrics and Gynecology

## 2022-10-21 VITALS — BP 109/76 | HR 93 | Wt 110.1 lb

## 2022-10-21 DIAGNOSIS — Z3401 Encounter for supervision of normal first pregnancy, first trimester: Secondary | ICD-10-CM | POA: Diagnosis not present

## 2022-10-21 DIAGNOSIS — Z124 Encounter for screening for malignant neoplasm of cervix: Secondary | ICD-10-CM | POA: Insufficient documentation

## 2022-10-21 DIAGNOSIS — Z3A12 12 weeks gestation of pregnancy: Secondary | ICD-10-CM | POA: Diagnosis not present

## 2022-10-21 LAB — POCT URINALYSIS DIPSTICK OB
Bilirubin, UA: NEGATIVE
Glucose, UA: NEGATIVE
Ketones, UA: NEGATIVE
Nitrite, UA: NEGATIVE
POC,PROTEIN,UA: NEGATIVE
Spec Grav, UA: 1.01 (ref 1.010–1.025)
Urobilinogen, UA: 0.2 E.U./dL
pH, UA: 8 (ref 5.0–8.0)

## 2022-10-21 NOTE — Progress Notes (Signed)
Patient presents today for New OB physical. She states daily nausea and takes zofran as needed. Due for her pap smear. Patient completed genetic testing, Materniti21 negative. She states no other questions or concerns at this time.

## 2022-10-21 NOTE — Progress Notes (Signed)
NOB: She continues to have daily nausea but is not always vomiting.  Taking Zofran intermittently.  Reports that her nausea and vomiting have improved.  She has had no vaginal bleeding.  Subchronic hemorrhage discussed.  No evidence of UTI at this point.  MaterniT 21 normal.  Pap smear performed.  Consider AFP next visit. Physical examination General NAD, Conversant  HEENT Atraumatic; Op clear with mmm.  Normo-cephalic. Pupils reactive. Anicteric sclerae  Thyroid/Neck Smooth without nodularity or enlargement. Normal ROM.  Neck Supple.  Skin No rashes, lesions or ulceration. Normal palpated skin turgor. No nodularity.  Breasts: No masses or discharge.  Symmetric.  No axillary adenopathy.  Lungs: Clear to auscultation.No rales or wheezes. Normal Respiratory effort, no retractions.  Heart: NSR.  No murmurs or rubs appreciated. No peripheral edema  Abdomen: Soft.  Non-tender.  No masses.  No HSM. No hernia  Extremities: Moves all appropriately.  Normal ROM for age. No lymphadenopathy.  Neuro: Oriented to PPT.  Normal mood. Normal affect.     Pelvic:   Vulva: Normal appearance.  No lesions.  Vagina: No lesions or abnormalities noted.  Support: Normal pelvic support.  Urethra No masses tenderness or scarring.  Meatus Normal size without lesions or prolapse.  Cervix: Normal appearance.  No lesions.  Anus: Normal exam.  No lesions.  Perineum: Normal exam.  No lesions.        Bimanual   Adnexae: No masses.  Non-tender to palpation.  Uterus: Enlarged. 14wks  FHT's 152  Non-tender.  Mobile.  AV.  Adnexae: No masses.  Non-tender to palpation.  Cul-de-sac: Negative for abnormality.  Adnexae: No masses.  Non-tender to palpation.         Pelvimetry   Diagonal: Reached.  Spines: Average.  Sacrum: Concave.  Pubic Arch: Normal.

## 2022-10-28 LAB — CYTOLOGY - PAP: Diagnosis: NEGATIVE

## 2022-11-18 ENCOUNTER — Ambulatory Visit (INDEPENDENT_AMBULATORY_CARE_PROVIDER_SITE_OTHER): Payer: BLUE CROSS/BLUE SHIELD | Admitting: Certified Nurse Midwife

## 2022-11-18 ENCOUNTER — Encounter: Payer: Self-pay | Admitting: Certified Nurse Midwife

## 2022-11-18 VITALS — BP 105/72 | HR 101 | Wt 113.8 lb

## 2022-11-18 DIAGNOSIS — Z3A16 16 weeks gestation of pregnancy: Secondary | ICD-10-CM

## 2022-11-18 DIAGNOSIS — Z3482 Encounter for supervision of other normal pregnancy, second trimester: Secondary | ICD-10-CM

## 2022-11-18 DIAGNOSIS — Z3480 Encounter for supervision of other normal pregnancy, unspecified trimester: Secondary | ICD-10-CM

## 2022-11-18 NOTE — Progress Notes (Signed)
ROB

## 2022-11-18 NOTE — Patient Instructions (Signed)
Round Ligament Pain  The round ligaments are a pair of cord-like tissues that help support the uterus. They can become a source of pain during pregnancy as the ligaments soften and stretch as the baby grows. The pain usually begins in the second trimester (13-28 weeks) of pregnancy, and should only last for a few seconds when it occurs. However, the pain can come and go until the baby is delivered. The pain does not cause harm to the baby. Round ligament pain is usually a short, sharp, and pinching pain, but it can also be a dull, lingering, and aching pain. The pain is felt in the lower side of the abdomen or in the groin. It usually starts deep in the groin and moves up to the outside of the hip area. The pain may happen when you: Suddenly change position, such as quickly going from a sitting to standing position. Do physical activity. Cough or sneeze. Follow these instructions at home: Managing pain  When the pain starts, relax. Then, try any of these methods to help with the pain: Sit down. Flex your knees up to your abdomen. Lie on your side with one pillow under your abdomen and another pillow between your legs. Sit in a warm bath for 15-20 minutes or until the pain goes away. General instructions Watch your condition for any changes. Move slowly when you sit down or stand up. Stop or reduce your physical activities if they cause pain. Avoid long walks if they cause pain. Take over-the-counter and prescription medicines only as told by your health care provider. Keep all follow-up visits. This is important. Contact a health care provider if: Your pain does not go away with treatment. You feel pain in your back that you did not have before. Your medicine is not helping. You have a fever or chills. You have nausea or vomiting. You have diarrhea. You have pain when you urinate. Get help right away if: You have pain that is a rhythmic, cramping pain similar to labor pains. Labor  pains are usually 2 minutes apart, last for about 1 minute, and involve a bearing down feeling or pressure in your pelvis. You have vaginal bleeding. These symptoms may represent a serious problem that is an emergency. Do not wait to see if the symptoms will go away. Get medical help right away. Call your local emergency services (911 in the [place] Do not drive yourself to the hospital. Summary Round ligament pain is felt in the lower abdomen or groin. This pain usually begins in the second trimester (13-28 weeks) and should only last for a few seconds when it occurs. You may notice the pain when you suddenly change position, when you cough or sneeze, or during physical activity. Relaxing, flexing your knees to your abdomen, lying on one side, or taking a warm bath may help to get rid of the pain. Contact your health care provider if the pain does not go away. This information is not intended to replace advice given to you by your health care provider. Make sure you discuss any questions you have with your health care provider. Document Revised: 07/31/2020 Document Reviewed: 07/31/2020 Elsevier Patient Education  2024 Elsevier Inc.  

## 2022-11-18 NOTE — Progress Notes (Signed)
ROB doing well, feeling some fluttering. Pt declines AFP testing. Discussed anatomy u/s next visit. Reviewed round ligament pain. Follow up 4 wks.   Doreene Burke, CNM

## 2022-12-14 ENCOUNTER — Encounter: Payer: Self-pay | Admitting: Obstetrics and Gynecology

## 2022-12-14 ENCOUNTER — Other Ambulatory Visit: Payer: Self-pay | Admitting: Certified Nurse Midwife

## 2022-12-14 MED ORDER — OMEPRAZOLE 10 MG PO CPDR: 10.0000 mg | DELAYED_RELEASE_CAPSULE | Freq: Every day | ORAL | 5 refills | Status: DC

## 2022-12-17 ENCOUNTER — Ambulatory Visit (INDEPENDENT_AMBULATORY_CARE_PROVIDER_SITE_OTHER): Payer: BLUE CROSS/BLUE SHIELD

## 2022-12-17 VITALS — BP 102/60 | HR 96 | Wt 116.0 lb

## 2022-12-17 DIAGNOSIS — Z3A21 21 weeks gestation of pregnancy: Secondary | ICD-10-CM

## 2022-12-17 DIAGNOSIS — Z3689 Encounter for other specified antenatal screening: Secondary | ICD-10-CM

## 2022-12-17 DIAGNOSIS — Z3480 Encounter for supervision of other normal pregnancy, unspecified trimester: Secondary | ICD-10-CM

## 2022-12-17 DIAGNOSIS — O43102 Malformation of placenta, unspecified, second trimester: Secondary | ICD-10-CM

## 2022-12-17 DIAGNOSIS — O43109 Malformation of placenta, unspecified, unspecified trimester: Secondary | ICD-10-CM | POA: Insufficient documentation

## 2022-12-17 NOTE — Assessment & Plan Note (Signed)
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 Plan for follow up growth ultrasound at 28 weeks.

## 2022-12-17 NOTE — Assessment & Plan Note (Signed)
Offered CBE resources. This is her third baby so she feels well prepared. Reviewed red flag warning signs anticipatory guidance for upcoming prenatal care.

## 2022-12-17 NOTE — Progress Notes (Signed)
    Return Prenatal Note   Assessment/Plan   Plan  22 y.o. G3P2002 at [redacted]w[redacted]d presents for follow-up OB visit. Reviewed prenatal record including previous visit note.  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 Plan for follow up growth ultrasound at 28 weeks.   Supervision of other normal pregnancy, antepartum Offered CBE resources. This is her third baby so she feels well prepared. Reviewed red flag warning signs anticipatory guidance for upcoming prenatal care.    Orders Placed This Encounter  Procedures   US OB Follow Up    Standing Status:   Future    Standing Expiration Date:   03/19/2023    Order Specific Question:   Reason for exam:    Answer:   growth ultrasound, placental lake    Order Specific Question:   Preferred imaging location?    Answer:   Internal   Return in about 4 weeks (around 01/14/2023) for ROB.   Future Appointments  Date Time Provider Department Center  12/17/2022  2:35 PM Danel Requena, Lindalou Hose, CNM [facility] None  01/14/2023 10:15 AM Dominic, Courtney Heys, CNM [facility] None  02/04/2023 10:15 AM [facility] [place] 1 [facility] None    For next visit:  continue with routine prenatal care     Subjective   22 y.o. [id number] at [redacted]w[redacted]d presents for this follow-up prenatal visit.  Patient has no concerns. Patient reports: Movement: Present Contractions: Not present  Objective   Flow sheet Vitals: Pulse Rate: 96 BP: 102/60 Fundal Height: 21 cm Fetal Heart Rate (bpm): 148 Total weight gain: 4 lb (1.814 kg)  General Appearance  No acute distress, well appearing, and well nourished Pulmonary   Normal work of breathing Neurologic   Alert and oriented to person, place, and time Psychiatric   Mood and affect within normal limits  Lindalou Hose Tenna Lacko, CNM  07/18/242:24 PM

## 2023-01-05 ENCOUNTER — Telehealth: Payer: Self-pay | Admitting: Licensed Practical Nurse

## 2023-01-05 NOTE — Telephone Encounter (Signed)
Reached out to pt to reschedule 02/04/2023 [place].  [place] tech is out of office and appt needs to be reschedule.  Can move the appt to 9/6 at 10:15.  Left message for pt to call back to reschedule.  Will send a MyChart message as well.

## 2023-01-07 NOTE — Telephone Encounter (Signed)
Pt is scheduled on 02/08/2023 at 4:00 for [place].

## 2023-01-14 ENCOUNTER — Ambulatory Visit (INDEPENDENT_AMBULATORY_CARE_PROVIDER_SITE_OTHER): Payer: BLUE CROSS/BLUE SHIELD | Admitting: Certified Nurse Midwife

## 2023-01-14 VITALS — BP 96/56 | HR 94 | Wt 118.0 lb

## 2023-01-14 DIAGNOSIS — Z113 Encounter for screening for infections with a predominantly sexual mode of transmission: Secondary | ICD-10-CM

## 2023-01-14 DIAGNOSIS — Z114 Encounter for screening for human immunodeficiency virus [HIV]: Secondary | ICD-10-CM

## 2023-01-14 DIAGNOSIS — Z369 Encounter for antenatal screening, unspecified: Secondary | ICD-10-CM

## 2023-01-14 DIAGNOSIS — Z3480 Encounter for supervision of other normal pregnancy, unspecified trimester: Secondary | ICD-10-CM

## 2023-01-14 DIAGNOSIS — Z3A25 25 weeks gestation of pregnancy: Secondary | ICD-10-CM

## 2023-01-14 DIAGNOSIS — Z131 Encounter for screening for diabetes mellitus: Secondary | ICD-10-CM

## 2023-01-14 DIAGNOSIS — Z3482 Encounter for supervision of other normal pregnancy, second trimester: Secondary | ICD-10-CM

## 2023-01-14 DIAGNOSIS — F5089 Other specified eating disorder: Secondary | ICD-10-CM

## 2023-01-14 NOTE — Progress Notes (Signed)
    Return Prenatal Note   Subjective   22 y.o. [id number] at [redacted]w[redacted]d presents for this follow-up prenatal visit. Madison Nichols reports craving ice & has history of anemia requiring IV iron.  Patient reports: Movement: Present Contractions: Not present  Objective   Flow sheet Vitals: Pulse Rate: 94 BP: (!) 96/56 Fundal Height: 25 cm Fetal Heart Rate (bpm): 140 Total weight gain: 6 lb (2.722 kg)  General Appearance  No acute distress, well appearing, and well nourished Pulmonary   Normal work of breathing Neurologic   Alert and oriented to person, place, and time Psychiatric   Mood and affect within normal limits  Assessment/Plan   Plan  22 y.o. [id number] at [redacted]w[redacted]d presents for follow-up OB visit. Reviewed prenatal record including previous visit note. 1. Supervision of other normal pregnancy, antepartum - 28 Week RH+Panel - CBC  2. Screening examination for venereal disease - 28 Week RH+Panel  3. Screening for human immunodeficiency virus - 28 Week RH+Panel  4. Antenatal screening encounter - 28 Week RH+Panel  5. Screening for diabetes mellitus - 28 Week RH+Panel  6. [redacted] weeks gestation of pregnancy  7. Pica - CBC  CBC today, discussed prescription Accrufer if anemic. Will repeat CBC with 28w labs/GDM screen. Follow up ultrasound for growth scheduled 9/9. Anticipatory guidance for GDM screen, TDAP discussed. Plans OCPs for contraception.  Orders Placed This Encounter  Procedures   28 Week RH+Panel   CBC   Return in 3 weeks (on 02/04/2023) for ROB & GDM screening.   Future Appointments  Date Time Provider Department Center  02/08/2023  4:00 PM [facility] [place] [facility] None    For next visit:  ROB with 1 hour gluocla, third trimester labs, and Tdap     Dominica Severin, CNM  01/13/2410:09 AM

## 2023-01-14 NOTE — Patient Instructions (Signed)
 Gestational Diabetes Mellitus, Diagnosis Gestational diabetes mellitus, or gestational diabetes, is diabetes that some people get when pregnant. This condition usually happens at 24-28 weeks of pregnancy.  People with gestational diabetes have high blood sugar. If you do not get treated for this condition, it may cause problems for you and your baby. It goes away after you give birth but can happen again the next time you become pregnant. What are the causes? This condition is caused by changes in your body when you're pregnant. When these changes happen: The pancreas may not make enough insulin. The body may not use insulin in the right way. This is called insulin resistance. Insulin helps your body use sugar for energy. If your body doesn't have enough insulin or can't use the insulin that it has, extra sugars stay in your blood. This leads to high blood sugar (hyperglycemia). What increases the risk? Being older than age 70 when pregnant. Too much body weight. Having polycystic ovary syndrome (PCOS). Having someone with diabetes in your family. Having had this condition in the past. Being pregnant with more than one baby. What are the signs or symptoms? You may not have any symptoms. If you do have symptoms, they may include: Being thirsty often. Being hungry often. Needing to pee more often. These symptoms can be missed because they're similar to other symptoms of pregnancy. How is this diagnosed? This condition may be diagnosed based on your blood sugar level and how your body responds to glucose. This may be checked with an oral glucose tolerance test (OGTT). The test may be done: Early in pregnancy if you have risk factors. At 24-28 weeks of your pregnancy. How is this treated? To treat this condition, you may be told to: Eat a healthy diet. Get more exercise. Check your blood sugar often. Your health care provider will tell you what your target is. Take insulin and other  medicines. These are taken if needed. Work with a diabetes expert. Follow these instructions at home: Learn about your diabetes Ask your provider: How often should I check my blood sugar? Where do I get the equipment? What medicines do I need? When should I take them? Do I need to meet with an educator? Who can I call if I have questions? Where can I find a support group? General instructions Take medicines only as told by your provider. Stay at a healthy weight. Drink enough fluid to keep your pee (urine) pale yellow. Check your pee for ketones when sick and as told. Ketones in your pee is a sign that your body is using fat for energy because it's not making enough insulin. Wear an alert bracelet or carry a card that shows you have this condition. Keep all follow-up visits. Your provider needs to check your health and your baby's growth. Where to find more information Gestational Diabetes: American Diabetes Association (ADA): diabetes.org Gestational Diabetes and Pregnancy: Centers for Disease Control and Prevention (CDC): TonerPromos.no American Pregnancy Association: americanpregnancy.org [place] MyPlate: WrestlingReporter.dk Contact a health care provider if:  Your blood sugar is above your target for two tests in a row. You have a high blood sugar level and you also have ketones in your pee. You have been sick or have had a fever for 2 days or more and aren't getting better. You have any of these problems for more than 6 hours: You can't eat or drink. You vomit or feel like you may vomit. You have watery poop (diarrhea). Get help right away if:  You become confused or cannot think clearly. You have trouble breathing. You have chest pain. Your baby is moving less than usual. You have unusual discharge or bleeding from your vagina. You have cramping in your belly or have pain in your hips or lower back. You have symptoms of high blood pressure or preeclampsia. These include: A severe,  throbbing headache that doesn't go away. Sudden or extreme swelling of your face, hands, legs, or feet. Vision problems, such as: Seeing spots. Blurry vision. Sensitivity to light. These symptoms may be an emergency. Get help right away. Call 911. Do not wait to see if the symptoms will go away. Do not drive yourself to the hospital. This information is not intended to replace advice given to you by your health care provider. Make sure you discuss any questions you have with your health care provider. Document Revised: 08/28/2022 Document Reviewed: 08/28/2022 Elsevier Patient Education  2024 Elsevier Inc.  Second Trimester of Pregnancy  The second trimester of pregnancy is from week 13 through week 27. This is months 4 through 6 of pregnancy. The second trimester is often a time when you feel your best. Your body has adjusted to being pregnant, and you begin to feel better physically. During the second trimester: Morning sickness has lessened or stopped completely. You may have more energy. You may have an increase in appetite. The second trimester is also a time when the unborn baby (fetus) is growing rapidly. At the end of the sixth month, the fetus may be up to 12 inches long and weigh about 1 pounds. You will likely begin to feel the baby move (quickening) between 16 and 20 weeks of pregnancy. Body changes during your second trimester Your body continues to go through many changes during your second trimester. The changes vary and generally return to normal after the baby is born. Physical changes Your weight will continue to increase. You will notice your lower abdomen bulging out. You may begin to get stretch marks on your hips, abdomen, and breasts. Your breasts will continue to grow and to become tender. Dark spots or blotches (chloasma or mask of pregnancy) may develop on your face. A dark line from your belly button to the pubic area (linea nigra) may appear. You may have  changes in your hair. These can include thickening of your hair, rapid growth, and changes in texture. Some people also have hair loss during or after pregnancy, or hair that feels dry or thin. Health changes You may develop headaches. You may have heartburn. You may develop constipation. You may develop hemorrhoids or swollen, bulging veins (varicose veins). Your gums may bleed and may be sensitive to brushing and flossing. You may urinate more often because the fetus is pressing on your bladder. You may have back pain. This is caused by: Weight gain. Pregnancy hormones that are relaxing the joints in your pelvis. A shift in weight and the muscles that support your balance. Follow these instructions at home: Medicines Follow your health care provider's instructions regarding medicine use. Specific medicines may be either safe or unsafe to take during pregnancy. Do not take any medicines unless approved by your health care provider. Take a prenatal vitamin that contains at least 600 micrograms (mcg) of folic acid. Eating and drinking Eat a healthy diet that includes fresh fruits and vegetables, whole grains, good sources of protein such as meat, eggs, or tofu, and low-fat dairy products. Avoid raw meat and unpasteurized juice, milk, and cheese. These carry germs  that can harm you and your baby. You may need to take these actions to prevent or treat constipation: Drink enough fluid to keep your urine pale yellow. Eat foods that are high in fiber, such as beans, whole grains, and fresh fruits and vegetables. Limit foods that are high in fat and processed sugars, such as fried or sweet foods. Activity Exercise only as directed by your health care provider. Most people can continue their usual exercise routine during pregnancy. Try to exercise for 30 minutes at least 5 days a week. Stop exercising if you develop contractions in your uterus. Stop exercising if you develop pain or cramping in the  lower abdomen or lower back. Avoid exercising if it is very hot or humid or if you are at a high altitude. Avoid heavy lifting. If you choose to, you may have sex unless your health care provider tells you not to. Relieving pain and discomfort Wear a supportive bra to prevent discomfort from breast tenderness. Take warm sitz baths to soothe any pain or discomfort caused by hemorrhoids. Use hemorrhoid cream if your health care provider approves. Rest with your legs raised (elevated) if you have leg cramps or low back pain. If you develop varicose veins: Wear support hose as told by your health care provider. Elevate your feet for 15 minutes, 3-4 times a day. Limit salt in your diet. Safety Wear your seat belt at all times when driving or riding in a car. Talk with your health care provider if someone is verbally or physically abusive to you. Lifestyle Do not use hot tubs, steam rooms, or saunas. Do not douche. Do not use tampons or scented sanitary pads. Avoid cat litter boxes and soil used by cats. These carry germs that can cause birth defects in the baby and possibly loss of the fetus by miscarriage or stillbirth. Do not use herbal remedies, alcohol, illegal drugs, or medicines that are not approved by your health care provider. Chemicals in these products can harm your baby. Do not use any products that contain nicotine or tobacco, such as cigarettes, e-cigarettes, and chewing tobacco. If you need help quitting, ask your health care provider. General instructions During a routine prenatal visit, your health care provider will do a physical exam and other tests. He or she will also discuss your overall health. Keep all follow-up visits. This is important. Ask your health care provider for a referral to a local prenatal education class. Ask for help if you have counseling or nutritional needs during pregnancy. Your health care provider can offer advice or refer you to specialists for help  with various needs. Where to find more information American Pregnancy Association: americanpregnancy.org [employer]ologists: https://www.todd-brady.net/ Office on [employer] Health: MightyReward.co.nz Contact a health care provider if you have: A headache that does not go away when you take medicine. Vision changes or you see spots in front of your eyes. Mild pelvic cramps, pelvic pressure, or nagging pain in the abdominal area. Persistent nausea, vomiting, or diarrhea. A bad-smelling vaginal discharge or foul-smelling urine. Pain when you urinate. Sudden or extreme swelling of your face, hands, ankles, feet, or legs. A fever. Get help right away if you: Have fluid leaking from your vagina. Have spotting or bleeding from your vagina. Have severe abdominal cramping or pain. Have difficulty breathing. Have chest pain. Have fainting spells. Have not felt your baby move for the time period told by your health care provider. Have new or increased pain, swelling, or  redness in an arm or leg. Summary The second trimester of pregnancy is from week 13 through week 27 (months 4 through 6). Do not use herbal remedies, alcohol, illegal drugs, or medicines that are not approved by your health care provider. Chemicals in these products can harm your baby. Exercise only as directed by your health care provider. Most people can continue their usual exercise routine during pregnancy. Keep all follow-up visits. This is important. This information is not intended to replace advice given to you by your health care provider. Make sure you discuss any questions you have with your health care provider. Document Revised: 10/25/2019 Document Reviewed: 08/31/2019 Elsevier Patient Education  2024 [employer].

## 2023-01-15 ENCOUNTER — Other Ambulatory Visit: Payer: Self-pay | Admitting: Certified Nurse Midwife

## 2023-01-15 LAB — CBC
Hematocrit: 32.5 % — ABNORMAL LOW (ref 34.0–46.6)
Hemoglobin: 10.3 g/dL — ABNORMAL LOW (ref 11.1–15.9)
MCH: 27.9 pg (ref 26.6–33.0)
MCHC: 31.7 g/dL (ref 31.5–35.7)
MCV: 88 fL (ref 79–97)
Platelets: 197 10*3/uL (ref 150–450)
RBC: 3.69 x10E6/uL — ABNORMAL LOW (ref 3.77–5.28)
RDW: 12.5 % (ref 11.7–15.4)
WBC: 6.3 10*3/uL (ref 3.4–10.8)

## 2023-01-15 MED ORDER — ACCRUFER 30 MG PO CAPS: 30.0000 mg | ORAL_CAPSULE | Freq: Two times a day (BID) | ORAL | 4 refills | Status: DC

## 2023-01-25 ENCOUNTER — Telehealth: Payer: Self-pay

## 2023-01-25 NOTE — Telephone Encounter (Signed)
Reached out to pt to reschedule 28 week labs and ROB appt that were scheduled for 02/04/2023.  Pt did not answer and could not leave a message bc mailbox was not set up.

## 2023-02-02 NOTE — Telephone Encounter (Signed)
Pt has been reschedule for 02/05/2023 for 28 wk labs and ROB appt.

## 2023-02-04 ENCOUNTER — Other Ambulatory Visit: Payer: BLUE CROSS/BLUE SHIELD

## 2023-02-04 ENCOUNTER — Encounter: Payer: BLUE CROSS/BLUE SHIELD | Admitting: Obstetrics & Gynecology

## 2023-02-05 ENCOUNTER — Ambulatory Visit (INDEPENDENT_AMBULATORY_CARE_PROVIDER_SITE_OTHER): Payer: BLUE CROSS/BLUE SHIELD | Admitting: Obstetrics & Gynecology

## 2023-02-05 ENCOUNTER — Other Ambulatory Visit: Payer: BLUE CROSS/BLUE SHIELD

## 2023-02-05 VITALS — BP 105/62 | HR 100 | Wt 123.0 lb

## 2023-02-05 DIAGNOSIS — Z3480 Encounter for supervision of other normal pregnancy, unspecified trimester: Secondary | ICD-10-CM

## 2023-02-05 DIAGNOSIS — Z23 Encounter for immunization: Secondary | ICD-10-CM

## 2023-02-05 DIAGNOSIS — Z3A28 28 weeks gestation of pregnancy: Secondary | ICD-10-CM

## 2023-02-05 DIAGNOSIS — Z3483 Encounter for supervision of other normal pregnancy, third trimester: Secondary | ICD-10-CM

## 2023-02-05 NOTE — Progress Notes (Signed)
   PRENATAL VISIT NOTE  Subjective:  Madison Nichols is a 22 y.o. G3P2002 at [redacted]w[redacted]d being seen today for ongoing prenatal care.  She is currently monitored for the following issues for this low-risk pregnancy and has Iron deficiency anemia; Exposure to [place] virus; Rubella non-immune status, antepartum; UTI (urinary tract infection); Supervision of other normal pregnancy, antepartum; and Placental abnormality on their problem list.  Patient reports no complaints.  Contractions: Not present. Vag. Bleeding: None.  Movement: Present. Denies leaking of fluid.   The following portions of the patient's history were reviewed and updated as appropriate: allergies, current medications, past family history, past medical history, past social history, past surgical history and problem list.   Objective:   Vitals:   02/05/23 0824  BP: 105/62  Pulse: 100  Weight: 123 lb (55.8 kg)    Fetal Status:     Movement: Present     General:  Alert, oriented and cooperative. Patient is in no acute distress.  Skin: Skin is warm and dry. No rash noted.   Cardiovascular: Normal heart rate noted  Respiratory: Normal respiratory effort, no problems with respiration noted  Abdomen: Soft, gravid, appropriate for gestational age.  Pain/Pressure: Absent     Pelvic: Cervical exam deferred        Extremities: Normal range of motion.     Mental Status: Normal mood and affect. Normal behavior. Normal judgment and thought content.   FH- 29 FHR- 130s  Assessment and Plan:  Pregnancy: G3P2002 at [redacted]w[redacted]d 1. Need for Tdap vaccination  - Tdap vaccine greater than or equal to 7yo IM  2. Supervision of other normal pregnancy, antepartum - she has a repeat ultrasound scheduled next week to follow growth/placental lakes  3. [redacted] weeks gestation of pregnancy - routine labs today  Preterm labor symptoms and general obstetric precautions including but not limited to vaginal bleeding, contractions, leaking of fluid and fetal  movement were reviewed in detail with the patient. Please refer to After Visit Summary for other counseling recommendations.   Return in about 3 weeks (around 02/26/2023).  Future Appointments  Date Time Provider Department Center  02/08/2023  4:00 PM [facility] [place] 1 [facility] None    Allie Bossier, MD

## 2023-02-06 LAB — 28 WEEK RH+PANEL
Basophils Absolute: 0 10*3/uL (ref 0.0–0.2)
Basos: 0 %
EOS (ABSOLUTE): 0 10*3/uL (ref 0.0–0.4)
Eos: 0 %
Gestational Diabetes Screen: 86 mg/dL (ref 70–139)
HIV Screen 4th Generation wRfx: NONREACTIVE
Hematocrit: 33 % — ABNORMAL LOW (ref 34.0–46.6)
Hemoglobin: 10.7 g/dL — ABNORMAL LOW (ref 11.1–15.9)
Immature Grans (Abs): 0 10*3/uL (ref 0.0–0.1)
Immature Granulocytes: 0 %
Lymphocytes Absolute: 1.6 10*3/uL (ref 0.7–3.1)
Lymphs: 24 %
MCH: 28.5 pg (ref 26.6–33.0)
MCHC: 32.4 g/dL (ref 31.5–35.7)
MCV: 88 fL (ref 79–97)
Monocytes Absolute: 0.5 10*3/uL (ref 0.1–0.9)
Monocytes: 7 %
Neutrophils Absolute: 4.5 10*3/uL (ref 1.4–7.0)
Neutrophils: 69 %
Platelets: 230 10*3/uL (ref 150–450)
RBC: 3.76 x10E6/uL — ABNORMAL LOW (ref 3.77–5.28)
RDW: 12.8 % (ref 11.7–15.4)
RPR Ser Ql: NONREACTIVE
WBC: 6.6 10*3/uL (ref 3.4–10.8)

## 2023-02-08 ENCOUNTER — Ambulatory Visit (INDEPENDENT_AMBULATORY_CARE_PROVIDER_SITE_OTHER): Payer: BLUE CROSS/BLUE SHIELD

## 2023-02-08 DIAGNOSIS — Z3483 Encounter for supervision of other normal pregnancy, third trimester: Secondary | ICD-10-CM

## 2023-02-08 DIAGNOSIS — Z362 Encounter for other antenatal screening follow-up: Secondary | ICD-10-CM | POA: Diagnosis not present

## 2023-02-08 DIAGNOSIS — Z3A28 28 weeks gestation of pregnancy: Secondary | ICD-10-CM

## 2023-02-08 DIAGNOSIS — Z3480 Encounter for supervision of other normal pregnancy, unspecified trimester: Secondary | ICD-10-CM

## 2023-02-15 ENCOUNTER — Encounter: Payer: Self-pay | Admitting: Certified Nurse Midwife

## 2023-02-16 ENCOUNTER — Encounter: Payer: Self-pay | Admitting: Obstetrics and Gynecology

## 2023-02-16 ENCOUNTER — Ambulatory Visit (INDEPENDENT_AMBULATORY_CARE_PROVIDER_SITE_OTHER): Payer: BLUE CROSS/BLUE SHIELD | Admitting: Obstetrics and Gynecology

## 2023-02-16 VITALS — BP 97/62 | HR 87 | Wt 125.6 lb

## 2023-02-16 DIAGNOSIS — Z23 Encounter for immunization: Secondary | ICD-10-CM

## 2023-02-16 DIAGNOSIS — Z3480 Encounter for supervision of other normal pregnancy, unspecified trimester: Secondary | ICD-10-CM

## 2023-02-16 DIAGNOSIS — Z3483 Encounter for supervision of other normal pregnancy, third trimester: Secondary | ICD-10-CM

## 2023-02-16 DIAGNOSIS — Z3A29 29 weeks gestation of pregnancy: Secondary | ICD-10-CM

## 2023-02-16 NOTE — Progress Notes (Signed)
ROB: Complains of spotting over the last several days.  The bleeding has not been heavy.  It has not followed intercourse.  Reports no cramping.  No leakage of fluid reports normal fetal movement.  Most recent growth scan 59th percentile and no placental lakes noted.   Speculum exam shows no evidence of bleeding today.  Cervix appears soft and somewhat friable possibly 1 cm dilation. I have reassured her.  But told her that if she continues to have spotting or worsening vaginal bleeding please let us know.

## 2023-02-16 NOTE — Progress Notes (Addendum)
Patient presents today due to spotting on and off since Sunday. She states no pain or cramping during this time. Patient also would like a Flu vaccine today, administered.

## 2023-02-26 ENCOUNTER — Ambulatory Visit (INDEPENDENT_AMBULATORY_CARE_PROVIDER_SITE_OTHER): Payer: BLUE CROSS/BLUE SHIELD | Admitting: Certified Nurse Midwife

## 2023-02-26 ENCOUNTER — Encounter: Payer: Self-pay | Admitting: Certified Nurse Midwife

## 2023-02-26 VITALS — BP 98/56 | HR 103 | Wt 125.0 lb

## 2023-02-26 DIAGNOSIS — Z3480 Encounter for supervision of other normal pregnancy, unspecified trimester: Secondary | ICD-10-CM

## 2023-02-26 DIAGNOSIS — Z3A31 31 weeks gestation of pregnancy: Secondary | ICD-10-CM

## 2023-02-26 NOTE — Patient Instructions (Signed)

## 2023-02-26 NOTE — Progress Notes (Signed)
    Return Prenatal Note   Subjective   22 y.o. [id number] at [redacted]w[redacted]d presents for this follow-up prenatal visit.  Annalyssa feeling well, active baby, no concerns. Consider IUD vs pill for postpartum contraception. Plans unmedicated birth and to formula feed.  Patient reports: Movement: Present Contractions: Not present  Objective   Flow sheet Vitals: Pulse Rate: (!) 103 BP: (!) 98/56 Fundal Height: 30 cm Fetal Heart Rate (bpm): 135 Presentation: Vertex Total weight gain: 13 lb (5.897 kg)  General Appearance  No acute distress, well appearing, and well nourished Pulmonary   Normal work of breathing Neurologic   Alert and oriented to person, place, and time Psychiatric   Mood and affect within normal limits  Assessment/Plan   Plan  22 y.o. [id number] at [redacted]w[redacted]d presents for follow-up OB visit. Reviewed prenatal record including previous visit note. 1. Supervision of other normal pregnancy, antepartum  2. [redacted] weeks gestation of pregnancy  Call signs and when to present for care reviewed.  No orders of the defined types were placed in this encounter.  Return in 2 weeks (on 03/12/2023) for ROB.   Future Appointments  Date Time Provider Department Center  03/12/2023  1:15 PM Julieanne Manson, MD [facility] None    For next visit:  continue with routine prenatal care     Dominica Severin, CNM  09/27/241:26 PM

## 2023-03-12 ENCOUNTER — Ambulatory Visit: Payer: BLUE CROSS/BLUE SHIELD | Admitting: Obstetrics

## 2023-03-12 VITALS — BP 102/60 | Wt 129.0 lb

## 2023-03-12 DIAGNOSIS — O43103 Malformation of placenta, unspecified, third trimester: Secondary | ICD-10-CM

## 2023-03-12 DIAGNOSIS — Z23 Encounter for immunization: Secondary | ICD-10-CM

## 2023-03-12 DIAGNOSIS — O99013 Anemia complicating pregnancy, third trimester: Secondary | ICD-10-CM

## 2023-03-12 DIAGNOSIS — Z3A33 33 weeks gestation of pregnancy: Secondary | ICD-10-CM

## 2023-03-12 DIAGNOSIS — Z3483 Encounter for supervision of other normal pregnancy, third trimester: Secondary | ICD-10-CM

## 2023-03-12 DIAGNOSIS — D508 Other iron deficiency anemias: Secondary | ICD-10-CM

## 2023-03-12 NOTE — Progress Notes (Signed)
    Return Prenatal Note   Subjective   22 y.o. [id number] at [redacted]w[redacted]d presents for this follow-up prenatal visit.  Patient reports baby moving well Patient reports: no problems, will take the RSV vaccine today. Desires IUD for postpartum, still deciding between hormonal vs copper.   Movement: Present Contractions: Not present Denies vaginal bleeding or loss of fluid. Objective   Flow sheet Vitals: BP: 102/60 Fundal Height: 33 cm Fetal Heart Rate (bpm): 138 Total weight gain: 17 lb (7.711 kg)  General Appearance  No acute distress, well appearing, and well nourished Pulmonary   Normal work of breathing Neurologic   Alert and oriented to person, place, and time Psychiatric   Mood and affect within normal limits  Assessment/Plan   Plan  22 y.o. [id number] at [redacted]w[redacted]d by [redacted]w[redacted]d [place] presents for follow-up OB visit. Reviewed prenatal record including previous visit note.   1. Encounter for supervision of other normal pregnancy in third trimester - Abrysvo vaccine given today  2. Placental abnormality in third trimester: lakes seen on anatomy US -Growth at 28wks: lakes not noted, appropriate growth 58.9%ile.  3. Other iron deficiency anemia: hx of requiring IVFe in past Hgb at 28wks = 10.7, continue PO iron.    RTC 2wks for ROBV, sooner prn   Julieanne Manson, DO [facility]

## 2023-03-16 ENCOUNTER — Other Ambulatory Visit: Payer: Self-pay

## 2023-03-16 ENCOUNTER — Encounter: Payer: Self-pay | Admitting: Obstetrics

## 2023-03-16 ENCOUNTER — Observation Stay
Admission: EM | Admit: 2023-03-16 | Discharge: 2023-03-16 | Disposition: A | Discharge status: Home / Self Care | Payer: BLUE CROSS/BLUE SHIELD | Attending: Obstetrics | Admitting: Obstetrics

## 2023-03-16 DIAGNOSIS — Z3A33 33 weeks gestation of pregnancy: Secondary | ICD-10-CM | POA: Insufficient documentation

## 2023-03-16 DIAGNOSIS — M549 Dorsalgia, unspecified: Secondary | ICD-10-CM | POA: Insufficient documentation

## 2023-03-16 DIAGNOSIS — O26899 Other specified pregnancy related conditions, unspecified trimester: Secondary | ICD-10-CM

## 2023-03-16 DIAGNOSIS — O368130 Decreased fetal movements, third trimester, not applicable or unspecified: Secondary | ICD-10-CM | POA: Diagnosis not present

## 2023-03-16 DIAGNOSIS — O26893 Other specified pregnancy related conditions, third trimester: Principal | ICD-10-CM | POA: Insufficient documentation

## 2023-03-16 DIAGNOSIS — R102 Pelvic and perineal pain: Secondary | ICD-10-CM | POA: Diagnosis not present

## 2023-03-16 DIAGNOSIS — O99891 Other specified diseases and conditions complicating pregnancy: Secondary | ICD-10-CM

## 2023-03-16 DIAGNOSIS — Z3480 Encounter for supervision of other normal pregnancy, unspecified trimester: Principal | ICD-10-CM

## 2023-03-16 DIAGNOSIS — O43103 Malformation of placenta, unspecified, third trimester: Secondary | ICD-10-CM

## 2023-03-16 LAB — URINALYSIS, COMPLETE (UACMP) WITH MICROSCOPIC
Bilirubin Urine: NEGATIVE
Glucose, UA: 50 mg/dL — AB
Ketones, ur: NEGATIVE mg/dL
Nitrite: NEGATIVE
Protein, ur: 100 mg/dL — AB
Specific Gravity, Urine: 1.012 (ref 1.005–1.030)
WBC, UA: >50 WBC/hpf (ref 0–5)
pH: 7 (ref 5.0–8.0)

## 2023-03-16 MED ORDER — LACTATED RINGERS IV SOLN: 500.0000 mL | INTRAVENOUS | Status: DC | PRN

## 2023-03-16 MED ORDER — HYDROXYZINE HCL 25 MG PO TABS: 50.0000 mg | ORAL_TABLET | Freq: Four times a day (QID) | ORAL | Status: DC | PRN

## 2023-03-16 MED ORDER — CALCIUM CARBONATE ANTACID 500 MG PO CHEW: 2.0000 | CHEWABLE_TABLET | ORAL | Status: DC | PRN

## 2023-03-16 MED ORDER — ONDANSETRON HCL 4 MG/2ML IJ SOLN: 4.0000 mg | Freq: Four times a day (QID) | INTRAMUSCULAR | Status: DC | PRN

## 2023-03-16 MED ORDER — SOD CITRATE-CITRIC ACID 500-334 MG/5ML PO SOLN: 30.0000 mL | ORAL | Status: DC | PRN

## 2023-03-16 MED ORDER — OMEPRAZOLE 10 MG PO CPDR: 10.0000 mg | DELAYED_RELEASE_CAPSULE | Freq: Every day | ORAL | 0 refills | Status: DC

## 2023-03-16 MED ORDER — ACETAMINOPHEN 325 MG PO TABS: 650.0000 mg | ORAL_TABLET | ORAL | Status: DC | PRN

## 2023-03-16 MED ORDER — ACETAMINOPHEN 500 MG PO TABS
1000.0000 mg | ORAL_TABLET | Freq: Once | ORAL | Status: AC
  Administered 2023-03-16: 1000 mg via ORAL
  Filled 2023-03-16: qty 2

## 2023-03-16 NOTE — OB Triage Note (Signed)
LABOR & DELIVERY OB TRIAGE NOTE  SUBJECTIVE  HPI Madison Nichols is a 22 y.o. G3P2002 at [redacted]w[redacted]d who presents to Labor & Delivery for back and side pain. She reports that she started feeling pain in her back and left side that comes and goes. She denies contractions or tightening. She denies LOF, vaginal bleeding, vaginal discharge, and urinary symptoms. She has not tried any medication or other comfort measures to relieve the pain. She reports she has had 2-3 bottles of water to drink today. She is feeling normal fetal movement.   OB History     Gravida  3   Para  2   Term  2   Preterm      AB      Living  2      SAB      IAB      Ectopic      Multiple  0   Live Births  2           Scheduled Meds:  acetaminophen  1,000 mg Oral Once   Continuous Infusions:  lactated ringers     PRN Meds:.calcium carbonate, hydrOXYzine, lactated ringers, ondansetron, sodium citrate-citric acid  OBJECTIVE  BP 113/64   Pulse (!) 102   Temp 98.5 F (36.9 C) (Oral)   Ht 4\' 11"  (1.499 m)   Wt 59 kg   LMP 07/18/2022 (Exact Date)   BMI 26.26 kg/m   General: alert, cooperative, NAD Abdomen: soft, gravid, non-tender. Fetus feels positioned predominantly on left side Back: straight, non-tender to palpation, no CVA tenderness Cervical exam: deferred  NST I reviewed the NST and it was reactive.  Baseline: 140 Variability: moderate Accelerations: present Decelerations: one variable decel, followed by approximately 30 minutes of Cat 1 tracing with accels Toco: irritability  ASSESSMENT Impression  1) Pregnancy at [id number], [redacted]w[redacted]d, Estimated Date of Delivery: 04/29/23 2) Reassuring maternal/fetal status 3) Musculoskeletal pain in pregnancy  PLAN 1) Discussed comfort measures: Tylenol, heating pad, hydration, stretching exercises, Miles circuit, warm bath. 2) Referral sent to chiropractor 3) Discharge home with standard labor/return precautions 4) Keep scheduled ROB  appointments  Guadlupe Spanish, CNM 03/16/23  2040

## 2023-03-16 NOTE — OB Triage Note (Signed)
Patient is a G3P2002 at 33wk5d coming in today for intermittent left sided back pain rating the pain a 7/10. Patient reports the pain came on suddenly around 1pm today, and has gotten progressively worse. Nothing has further alleviated or exacerbated this pain. Patient reports +FM, and denies leaking of fluid, vaginal bleeding, or contractions. Vital signs obtained and within normal limits. Swanson, CNM has been notified of patient's arrival to the floor.

## 2023-03-17 ENCOUNTER — Observation Stay
Admission: EM | Admit: 2023-03-17 | Discharge: 2023-03-17 | Disposition: A | Discharge status: Home / Self Care | Payer: BLUE CROSS/BLUE SHIELD | Attending: Obstetrics and Gynecology | Admitting: Obstetrics and Gynecology

## 2023-03-17 ENCOUNTER — Encounter: Payer: Self-pay | Admitting: Obstetrics and Gynecology

## 2023-03-17 DIAGNOSIS — Z3A33 33 weeks gestation of pregnancy: Secondary | ICD-10-CM | POA: Diagnosis not present

## 2023-03-17 DIAGNOSIS — O43103 Malformation of placenta, unspecified, third trimester: Secondary | ICD-10-CM

## 2023-03-17 DIAGNOSIS — R109 Unspecified abdominal pain: Secondary | ICD-10-CM | POA: Insufficient documentation

## 2023-03-17 DIAGNOSIS — O26893 Other specified pregnancy related conditions, third trimester: Secondary | ICD-10-CM | POA: Diagnosis present

## 2023-03-17 DIAGNOSIS — B9689 Other specified bacterial agents as the cause of diseases classified elsewhere: Secondary | ICD-10-CM

## 2023-03-17 DIAGNOSIS — O2343 Unspecified infection of urinary tract in pregnancy, third trimester: Principal | ICD-10-CM | POA: Insufficient documentation

## 2023-03-17 DIAGNOSIS — N390 Urinary tract infection, site not specified: Secondary | ICD-10-CM | POA: Diagnosis present

## 2023-03-17 DIAGNOSIS — Z79899 Other long term (current) drug therapy: Secondary | ICD-10-CM | POA: Insufficient documentation

## 2023-03-17 DIAGNOSIS — Z3480 Encounter for supervision of other normal pregnancy, unspecified trimester: Principal | ICD-10-CM

## 2023-03-17 DIAGNOSIS — O219 Vomiting of pregnancy, unspecified: Secondary | ICD-10-CM

## 2023-03-17 LAB — CBC WITH DIFFERENTIAL/PLATELET
Abs Immature Granulocytes: 0.01 10*3/uL (ref 0.00–0.07)
Basophils Absolute: 0 10*3/uL (ref 0.0–0.1)
Basophils Relative: 0 %
Eosinophils Absolute: 0 10*3/uL (ref 0.0–0.5)
Eosinophils Relative: 0 %
HCT: 28.5 % — ABNORMAL LOW (ref 36.0–46.0)
Hemoglobin: 9.3 g/dL — ABNORMAL LOW (ref 12.0–15.0)
Immature Granulocytes: 0 %
Lymphocytes Relative: 5 %
Lymphs Abs: 0.2 10*3/uL — ABNORMAL LOW (ref 0.7–4.0)
MCH: 26.1 pg (ref 26.0–34.0)
MCHC: 32.6 g/dL (ref 30.0–36.0)
MCV: 79.8 fL — ABNORMAL LOW (ref 80.0–100.0)
Monocytes Absolute: 0 10*3/uL — ABNORMAL LOW (ref 0.1–1.0)
Monocytes Relative: 1 %
Neutro Abs: 3.7 10*3/uL (ref 1.7–7.7)
Neutrophils Relative %: 94 %
Platelets: 168 10*3/uL (ref 150–400)
RBC: 3.57 MIL/uL — ABNORMAL LOW (ref 3.87–5.11)
RDW: 14.5 % (ref 11.5–15.5)
WBC: 4 10*3/uL (ref 4.0–10.5)
nRBC: 0 % (ref 0.0–0.2)

## 2023-03-17 LAB — URINALYSIS, COMPLETE (UACMP) WITH MICROSCOPIC
Bilirubin Urine: NEGATIVE
Glucose, UA: NEGATIVE mg/dL
Hgb urine dipstick: NEGATIVE
Ketones, ur: 20 mg/dL — AB
Nitrite: POSITIVE — AB
Protein, ur: 30 mg/dL — AB
Specific Gravity, Urine: 1.012 (ref 1.005–1.030)
WBC, UA: >50 WBC/hpf (ref 0–5)
pH: 6 (ref 5.0–8.0)

## 2023-03-17 MED ORDER — NITROFURANTOIN MONOHYD MACRO 100 MG PO CAPS
100.0000 mg | ORAL_CAPSULE | Freq: Two times a day (BID) | ORAL | Status: DC
  Administered 2023-03-17: 100 mg via ORAL

## 2023-03-17 MED ORDER — PHENAZOPYRIDINE HCL 200 MG PO TABS
200.0000 mg | ORAL_TABLET | Freq: Three times a day (TID) | ORAL | Status: DC
  Filled 2023-03-17: qty 1

## 2023-03-17 MED ORDER — LACTATED RINGERS IV BOLUS
500.0000 mL | Freq: Once | INTRAVENOUS | Status: AC
  Administered 2023-03-17: 500 mL via INTRAVENOUS

## 2023-03-17 MED ORDER — NITROFURANTOIN MONOHYD MACRO 100 MG PO CAPS
ORAL_CAPSULE | ORAL | Status: AC
  Filled 2023-03-17: qty 1

## 2023-03-17 MED ORDER — ACETAMINOPHEN 500 MG PO TABS: 1000.0000 mg | ORAL_TABLET | Freq: Four times a day (QID) | ORAL | Status: DC | PRN

## 2023-03-17 MED ORDER — SODIUM CHLORIDE 0.9 % IV SOLN
1.0000 g | INTRAVENOUS | Status: DC
  Administered 2023-03-17: 1 g via INTRAVENOUS
  Filled 2023-03-17: qty 10

## 2023-03-17 MED ORDER — PHENAZOPYRIDINE HCL 200 MG PO TABS: 200.0000 mg | ORAL_TABLET | Freq: Three times a day (TID) | ORAL | 0 refills | Status: DC

## 2023-03-17 MED ORDER — ONDANSETRON HCL 4 MG PO TABS: 4.0000 mg | ORAL_TABLET | Freq: Three times a day (TID) | ORAL | 0 refills | Status: DC | PRN

## 2023-03-17 MED ORDER — ACETAMINOPHEN 500 MG PO TABS
1000.0000 mg | ORAL_TABLET | Freq: Four times a day (QID) | ORAL | Status: DC | PRN
  Administered 2023-03-17: 1000 mg via ORAL
  Filled 2023-03-17: qty 2

## 2023-03-17 MED ORDER — NITROFURANTOIN MONOHYD MACRO 100 MG PO CAPS: 100.0000 mg | ORAL_CAPSULE | Freq: Two times a day (BID) | ORAL | 0 refills | Status: DC

## 2023-03-17 NOTE — Discharge Summary (Signed)
Physician Final Progress Note  Patient ID: Madison Nichols MRN: [id number] DOB/AGE: [birth year] 23 y.o.  Admit date: 03/17/2023 Admitting provider: Linzie Collin, MD Discharge date: 03/17/2023   Admission Diagnoses:  1) intrauterine pregnancy at [redacted]w[redacted]d  2) UTI   Discharge Diagnoses:  Principal Problem:   Indication for care in labor and delivery, antepartum Active Problems:   UTI (urinary tract infection)   History of Present Illness: The patient is a 22 y.o. female G3P2002 at [redacted]w[redacted]d who presents for right sided pain, fever, nausea and vomiting.She was evaluated in triage yesterday for back and side pain, she was  sent home with comfort measures. This morning she woke up not feeling well, she has had a stabbing pain on her right side, it is constant. She took Tylenol around 0930, which helped a little. Around 1300 she had N/V and a temp of 104.1. She has not been able to keep anything down since. She saw her provider at [facility] that told her she has a UTI and sent her to L and D triage. She had had an increased urgency and dysuria today. She endorses +FM, denies contractions or headache.   Past Medical History:  Diagnosis Date   Iron deficiency anemia    NSVD (normal spontaneous vaginal delivery) 08/11/2020    Past Surgical History:  Procedure Laterality Date   WISDOM TOOTH EXTRACTION  2023   four    No current facility-administered medications on file prior to encounter.   Current Outpatient Medications on File Prior to Encounter  Medication Sig Dispense Refill   Ferric Maltol (ACCRUFER) 30 MG CAPS Take 1 capsule (30 mg total) by mouth 2 (two) times daily. Take 1h before or 2h after meals. 60 capsule 4   omeprazole (PRILOSEC) 10 MG capsule Take 1 capsule (10 mg total) by mouth daily. May increase to 20 mg daily after 2-4 weeks 30 capsule 0   ondansetron (ZOFRAN) 4 MG tablet Take 1 tablet (4 mg total) by mouth every 8 (eight) hours as needed for nausea or vomiting.  (Patient not taking: Reported on 02/05/2023) 20 tablet 0   Prenatal Vit-Fe Fumarate-FA (MULTIVITAMIN-PRENATAL) 27-0.8 MG TABS tablet Take 1 tablet by mouth daily at 12 noon.      No Known Allergies  Social History   Socioeconomic History   Marital status: Single    Spouse name: Not on file   Number of children: 2   Years of education: 14   Highest education level: Not on file  Occupational History   Occupation: student  Tobacco Use   Smoking status: Never   Smokeless tobacco: Never  Vaping Use   Vaping status: Never Used  Substance and Sexual Activity   Alcohol use: Not Currently   Drug use: Never   Sexual activity: Yes    Partners: Male    Birth control/protection: None  Other Topics Concern   Not on file  Social History Narrative   Not on file   Social Determinants of Health   Financial Resource Strain: Low Risk  (09/28/2022)   Overall Financial Resource Strain (CARDIA)    Difficulty of Paying Living Expenses: Not hard at all  Food Insecurity: No Food Insecurity (09/28/2022)   Hunger Vital Sign    Worried About Running Out of Food in the Last Year: Never true    Ran Out of Food in the Last Year: Never true  Transportation Needs: No Transportation Needs (09/28/2022)   PRAPARE - Administrator, Civil Service (Medical): No  Lack of Transportation (Non-Medical): No  Physical Activity: Sufficiently Active (09/28/2022)   Exercise Vital Sign    Days of Exercise per Week: 2 days    Minutes of Exercise per Session: 90 min  Stress: No Stress Concern Present (09/28/2022)   [employer] of Occupational Health - Occupational Stress Questionnaire    Feeling of Stress : Not at all  Social Connections: Moderately Integrated (09/28/2022)   Social Connection and Isolation Panel [NHANES]    Frequency of Communication with Friends and Family: Three times a week    Frequency of Social Gatherings with Friends and Family: Twice a week    Attends Religious Services: More  than 4 times per year    Active Member of [employer] or Organizations: No    Attends Banker Meetings: Never    Marital Status: Living with partner  Intimate Partner Violence: Not At Risk (09/28/2022)   Humiliation, Afraid, Rape, and Kick questionnaire    Fear of Current or Ex-Partner: No    Emotionally Abused: No    Physically Abused: No    Sexually Abused: No    Family History  Problem Relation Age of Onset   Healthy Mother    Healthy Father    Healthy Brother    Healthy Brother    Healthy Maternal Grandmother    Healthy Maternal Grandfather    Healthy Paternal Grandmother    Healthy Paternal Grandfather      ROS see HPI   Physical Exam: BP (!) 109/59 (BP Location: Right Arm)   Pulse (!) 126   Temp 99.8 F (37.7 C) (Oral)   Resp 18   Ht 4\' 11"  (1.499 m)   Wt 57.2 kg   LMP 07/18/2022 (Exact Date)   BMI 25.45 kg/m   Physical Exam Constitutional:      Appearance: Normal appearance.  Cardiovascular:     Rate and Rhythm: Regular rhythm. Tachycardia present.     Pulses: Normal pulses.     Heart sounds: Normal heart sounds. No murmur heard.    No gallop.  Pulmonary:     Effort: Pulmonary effort is normal.     Breath sounds: Normal breath sounds.  Abdominal:     Tenderness: There is no abdominal tenderness. There is right CVA tenderness. There is no left CVA tenderness.     Comments: gravid  Musculoskeletal:        General: Normal range of motion.     Cervical back: Normal range of motion.     Right lower leg: No edema.     Left lower leg: No edema.  Neurological:     General: No focal deficit present.     Mental Status: She is alert.  Skin:    General: Skin is warm.  Psychiatric:        Mood and Affect: Mood normal.   EFM: baseline 150,moderate variability, pos accel, neg decel  TOCO: rare  Consults: None  Significant Findings/ Diagnostic Studies: labs: CBC    Component Value Date/Time   WBC 4.0 03/17/2023 1710   RBC 3.57 (L) 03/17/2023 1710    HGB 9.3 (L) 03/17/2023 1710   HGB 10.7 (L) 02/05/2023 0923   HCT 28.5 (L) 03/17/2023 1710   HCT 33.0 (L) 02/05/2023 0923   PLT 168 03/17/2023 1710   PLT 230 02/05/2023 0923   MCV 79.8 (L) 03/17/2023 1710   MCV 88 02/05/2023 0923   MCH 26.1 03/17/2023 1710   MCHC 32.6 03/17/2023 1710   RDW 14.5 03/17/2023 1710  RDW 12.8 02/05/2023 0923   LYMPHSABS 0.2 (L) 03/17/2023 1710   LYMPHSABS 1.6 02/05/2023 0923   MONOABS 0.0 (L) 03/17/2023 1710   EOSABS 0.0 03/17/2023 1710   EOSABS 0.0 02/05/2023 0923   BASOSABS 0.0 03/17/2023 1710   BASOSABS 0.0 02/05/2023 0923    UA   Latest Reference Range & Units 03/17/23 17:10  Appearance CLEAR  CLOUDY !  Bilirubin Urine NEGATIVE  NEGATIVE  Color, Urine YELLOW  YELLOW !  Glucose, UA NEGATIVE mg/dL NEGATIVE  Hgb urine dipstick NEGATIVE  NEGATIVE  Ketones, ur NEGATIVE mg/dL 20 !  Leukocytes,Ua NEGATIVE  LARGE !  Nitrite NEGATIVE  POSITIVE !  pH 5.0 - 8.0  6.0  Protein NEGATIVE mg/dL 30 !  Specific Gravity, Urine 1.005 - 1.030  1.012  Bacteria, UA NONE SEEN  MANY !  Mucus  PRESENT  RBC / HPF 0 - 5 RBC/hpf 0-5  Squamous Epithelial / HPF 0 - 5 /HPF 0-5  WBC Clumps  PRESENT  WBC, UA 0 - 5 WBC/hpf >50   Urine culture pending   Procedures: RNST   Hospital Course: The patient was admitted to Labor and Delivery Triage for observation. She was given 1 liter IV fluid bolus, ceftriaxone 1 gram IV, 1,000mg  Tylenol. Overtime she reported that her pain had improved, but still had pain and would like additional medication. She has been able to tolerate PO. Reviewed Pt's symptoms and labs with Dr Logan Bores. May treat pt for a UTI outpatient so long as she is tolerating PO. The patient does not need to be admitted. Pyridium was given. Pt discharged home to follow up at her scheduled [facility] later this week.   Discharge Condition: fair  Disposition: Discharge disposition: 01-Home or Self Care       Diet: Regular diet  Discharge Activity: Activity as  tolerated   Allergies as of 03/17/2023   No Known Allergies      Medication List     TAKE these medications    ACCRUFeR 30 MG Caps Generic drug: Ferric Maltol Take 1 capsule (30 mg total) by mouth 2 (two) times daily. Take 1h before or 2h after meals.   acetaminophen 500 MG tablet Commonly known as: TYLENOL Take 2 tablets (1,000 mg total) by mouth every 6 (six) hours as needed for fever or headache.   multivitamin-prenatal 27-0.8 MG Tabs tablet Take 1 tablet by mouth daily at 12 noon.   nitrofurantoin (macrocrystal-monohydrate) 100 MG capsule Commonly known as: MACROBID Take 1 capsule (100 mg total) by mouth every 12 (twelve) hours.   omeprazole 10 MG capsule Commonly known as: PRILOSEC Take 1 capsule (10 mg total) by mouth daily. May increase to 20 mg daily after 2-4 weeks   ondansetron 4 MG tablet Commonly known as: Zofran Take 1 tablet (4 mg total) by mouth every 8 (eight) hours as needed for nausea or vomiting.   phenazopyridine 200 MG tablet Commonly known as: PYRIDIUM Take 1 tablet (200 mg total) by mouth 3 (three) times daily with meals. Start taking on: March 18, 2023         Total time spent taking care of this patient: 20 minutes  Signed: Ellouise Newer [facility], CNM  03/17/2023, 8:04 PM

## 2023-03-17 NOTE — Progress Notes (Signed)
Pt discharged from facility with ambulatory and steady gait. Pt given discharge instructions with verbal acknowledgment. No further questions from patient.

## 2023-03-17 NOTE — OB Triage Note (Signed)
Patient is a [id number] at [redacted]w[redacted]d who wes sent over from [facility] for evaluation. Upon arrival, patient c/o right sided flank pain that started since last night, vomiting, and fever. Reports +FM, denies LOF, ctx, and vaginal bleeding. External monitors applied and assessing. Initial FHT 180. Patient febrile at 100.5 .

## 2023-03-18 ENCOUNTER — Telehealth: Payer: Self-pay

## 2023-03-18 NOTE — Telephone Encounter (Signed)
The patient is scheduled with LMD for 10/18 at 10:35 for ED follow up

## 2023-03-19 ENCOUNTER — Ambulatory Visit (INDEPENDENT_AMBULATORY_CARE_PROVIDER_SITE_OTHER): Payer: BLUE CROSS/BLUE SHIELD | Admitting: Licensed Practical Nurse

## 2023-03-19 ENCOUNTER — Encounter: Payer: Self-pay | Admitting: Licensed Practical Nurse

## 2023-03-19 VITALS — BP 95/55 | HR 104 | Wt 127.7 lb

## 2023-03-19 DIAGNOSIS — Z3480 Encounter for supervision of other normal pregnancy, unspecified trimester: Secondary | ICD-10-CM

## 2023-03-19 DIAGNOSIS — Z3A34 34 weeks gestation of pregnancy: Secondary | ICD-10-CM

## 2023-03-19 NOTE — Progress Notes (Signed)
Routine Prenatal Care Visit  Subjective  Madison Nichols is a 22 y.o. G3P2002 at [redacted]w[redacted]d being seen today for ongoing prenatal care.  She is currently monitored for the following issues for this low-risk pregnancy and has Iron deficiency anemia; UTI (urinary tract infection); Supervision of other normal pregnancy, antepartum; Placental abnormality; Labor and delivery, indication for care; and Indication for care in labor and delivery, antepartum on their problem list.  ----------------------------------------------------------------------------------- Patient reports Here with partner, fu from triage visit: -Was seen 10/16 for a UTI was febrile with right sided pain. Was given Rocephin and sent home on Macrobid and Pyridium. Domenica Fail reports she is starting to feel better, she last took Tylenol last night, she did have chills yesterday. Otherwise denies fever. Reports pain on her side and lower abdomen is improved  rates it "2-3/10". She did have some vomiting yesterday, she has not had any today. She has not yet eaten today.  -They have a 52 and 23 year old at home, a lot of family around for support -would like an unmedicated birth   Contractions: Not present. Vag. Bleeding: None.  Movement: Present. Leaking Fluid denies.  ----------------------------------------------------------------------------------- The following portions of the patient's history were reviewed and updated as appropriate: allergies, current medications, past family history, past medical history, past social history, past surgical history and problem list. Problem list updated.  Objective  Blood pressure (!) 95/55, pulse (!) 104, weight 127 lb 11.2 oz (57.9 kg), last menstrual period 07/18/2022. Pregravid weight 112 lb (50.8 kg) Total Weight Gain 15 lb 11.2 oz (7.121 kg) Urinalysis: Urine Protein    Urine Glucose    Fetal Status: Fetal Heart Rate (bpm): 144 Fundal Height: 34 cm Movement: Present     General:  Alert, oriented and  cooperative. Patient is in no acute distress.  Skin: Skin is warm and dry. No rash noted.   Cardiovascular: Normal heart rate noted  Respiratory: Normal respiratory effort, no problems with respiration noted  Abdomen: Soft, gravid, appropriate for gestational age. Pain/Pressure: Absent     Pelvic:  Cervical exam deferred        Extremities: Normal range of motion.  Edema: None  Mental Status: Normal mood and affect. Normal behavior. Normal judgment and thought content.   Assessment   22 y.o. 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 28wk Growth 59%ile, 1326g.       Supervision of other normal pregnancy, antepartum 09/28/2022 by Loran Senters, CMA No   Overview Addendum 03/12/2023  1:42 PM by Julieanne Manson, MD     Clinical Staff Provider  Office Location  [facility] Dating  04/29/2023, by Ultrasound  Language  English Anatomy US   placental lake 2.82 x 2.44 x 1.75 cm-growth at 28w = 59%/1326g  Flu Vaccine  02/16/23 Genetic Screen  NIPS: low risk, XX  TDaP vaccine  02/05/23 Hgb A1C or  GTT Early : Third trimester : 98  Covid UTD but needs to be entered in HM RSV: 03/12/23 ([redacted]w[redacted]d) LAB RESULTS   Rhogam  O/Positive/-- (05/10 1521)  Blood Type O/Positive/-- (05/10 1521)   Feeding Plan formula Antibody Negative (05/10 1521)  Contraception IUD Rubella <0.90 (05/10 1521)  Circumcision no  RPR Non Reactive (09/06 0923)   Pediatrician  [facility] HBsAg Negative (05/10 1521)   Support Person Jesus HIV Non Reactive (09/06 1308)  Prenatal Classes no Varicella     GBS  (For PCN allergy, check sensitivities)   BTL Consent  Hep C Non Reactive (05/10 1521)   VBAC Consent  Pap Diagnosis  Date Value Ref Range Status   10/21/2022   Final   - Negative for intraepithelial lesion or malignancy (NILM)      Hgb Electro      CF      SMA                    Preterm labor symptoms and general obstetric precautions including but not limited to vaginal bleeding, contractions, leaking of fluid and fetal movement were reviewed in detail with the patient. Please refer to After Visit Summary for other counseling recommendations.   Return for keep next apt .  Carie Caddy, CNM  [facility]  03/19/23  12:22 PM

## 2023-03-20 LAB — URINE CULTURE: Culture: >=100000 — AB

## 2023-03-21 ENCOUNTER — Observation Stay
Admission: EM | Admit: 2023-03-21 | Discharge: 2023-03-22 | Disposition: A | Discharge status: Home / Self Care | Payer: BLUE CROSS/BLUE SHIELD | Attending: Certified Nurse Midwife | Admitting: Certified Nurse Midwife

## 2023-03-21 ENCOUNTER — Encounter: Payer: Self-pay | Admitting: Certified Nurse Midwife

## 2023-03-21 ENCOUNTER — Other Ambulatory Visit: Payer: Self-pay

## 2023-03-21 DIAGNOSIS — Z3480 Encounter for supervision of other normal pregnancy, unspecified trimester: Principal | ICD-10-CM

## 2023-03-21 DIAGNOSIS — R519 Headache, unspecified: Secondary | ICD-10-CM | POA: Insufficient documentation

## 2023-03-21 DIAGNOSIS — M549 Dorsalgia, unspecified: Secondary | ICD-10-CM | POA: Diagnosis present

## 2023-03-21 DIAGNOSIS — D509 Iron deficiency anemia, unspecified: Secondary | ICD-10-CM | POA: Diagnosis not present

## 2023-03-21 DIAGNOSIS — Z1152 Encounter for screening for COVID-19: Secondary | ICD-10-CM | POA: Insufficient documentation

## 2023-03-21 DIAGNOSIS — O26893 Other specified pregnancy related conditions, third trimester: Secondary | ICD-10-CM | POA: Diagnosis present

## 2023-03-21 DIAGNOSIS — O99013 Anemia complicating pregnancy, third trimester: Secondary | ICD-10-CM | POA: Diagnosis not present

## 2023-03-21 DIAGNOSIS — O43103 Malformation of placenta, unspecified, third trimester: Secondary | ICD-10-CM

## 2023-03-21 DIAGNOSIS — O99891 Other specified diseases and conditions complicating pregnancy: Secondary | ICD-10-CM | POA: Diagnosis present

## 2023-03-21 DIAGNOSIS — Z3A34 34 weeks gestation of pregnancy: Secondary | ICD-10-CM | POA: Diagnosis not present

## 2023-03-21 LAB — URINALYSIS, ROUTINE W REFLEX MICROSCOPIC
Bilirubin Urine: NEGATIVE
Glucose, UA: NEGATIVE mg/dL
Hgb urine dipstick: NEGATIVE
Ketones, ur: 20 mg/dL — AB
Nitrite: POSITIVE — AB
Protein, ur: NEGATIVE mg/dL
Specific Gravity, Urine: 1.011 (ref 1.005–1.030)
pH: 6 (ref 5.0–8.0)

## 2023-03-21 LAB — WET PREP, GENITAL
Clue Cells Wet Prep HPF POC: NONE SEEN
Sperm: NONE SEEN
Trich, Wet Prep: NONE SEEN
WBC, Wet Prep HPF POC: >=10 — AB (ref <10)
Yeast Wet Prep HPF POC: NONE SEEN

## 2023-03-21 LAB — SARS CORONAVIRUS 2 BY RT PCR: SARS Coronavirus 2 by RT PCR: NEGATIVE

## 2023-03-21 MED ORDER — SOD CITRATE-CITRIC ACID 500-334 MG/5ML PO SOLN: 30.0000 mL | ORAL | Status: DC | PRN

## 2023-03-21 MED ORDER — ONDANSETRON HCL 4 MG/2ML IJ SOLN: 4.0000 mg | Freq: Four times a day (QID) | INTRAMUSCULAR | Status: DC | PRN

## 2023-03-21 MED ORDER — ACETAMINOPHEN 500 MG PO TABS
1000.0000 mg | ORAL_TABLET | Freq: Four times a day (QID) | ORAL | Status: DC | PRN
  Administered 2023-03-22: 1000 mg via ORAL
  Filled 2023-03-21: qty 2

## 2023-03-21 NOTE — OB Triage Provider Note (Incomplete)
LABOR & DELIVERY OB TRIAGE NOTE  SUBJECTIVE  HPI Madison Nichols is a 22 y.o. G3P2002 at [redacted]w[redacted]d who presents to Labor & Delivery for fever, chills, headache, dizziness. Madison Nichols reports fever at home 100.8 today. She last took tylenol at 2pm. She was treated for a UTI 10/16-she received a single dose of IM Rocephin and was sent home on oral Macrobid and Pyridium. Since that treatment her flank & back pain has resolved. She denies dysuria, abdominal tenderness, contractions, vaginal leaking of fluid or vaginal bleeding.   OB History     Gravida  3   Para  2   Term  2   Preterm      AB      Living  2      SAB      IAB      Ectopic      Multiple  0   Live Births  2           Scheduled Meds: Continuous Infusions: PRN Meds:.acetaminophen, ondansetron, sodium citrate-citric acid  OBJECTIVE  Temp 98.9 F (37.2 C) (Oral)   Ht 4\' 11"  (1.499 m)   Wt 56 kg   LMP 07/18/2022 (Exact Date)   BMI 24.94 kg/m   General: A&OX3, NAD Heart: Lungs: Abdomen: Soft, non-tender, no CVA tenderness, no suprapubic tenderness. Cervical exam:   deferred  NST I reviewed the NST and it was reactive.  Baseline: 150 Variability: moderate Accelerations: present Decelerations:none Toco: occasional, irregular Category I  ASSESSMENT Impression  1) Pregnancy at G3P2002, [redacted]w[redacted]d, Estimated Date of Delivery: 04/29/23 2) Reassuring maternal/fetal status 3)  PLAN CBC, CMP, Wet Prep, GC/Ct, UA & Culture ordered & collected.  Dominica Severin, CNM 03/21/23  10:56 PM

## 2023-03-22 ENCOUNTER — Telehealth: Payer: Self-pay

## 2023-03-22 DIAGNOSIS — O99013 Anemia complicating pregnancy, third trimester: Secondary | ICD-10-CM | POA: Diagnosis not present

## 2023-03-22 LAB — COMPREHENSIVE METABOLIC PANEL
ALT: 8 U/L (ref 0–44)
AST: 12 U/L — ABNORMAL LOW (ref 15–41)
Albumin: 2.4 g/dL — ABNORMAL LOW (ref 3.5–5.0)
Alkaline Phosphatase: 108 U/L (ref 38–126)
Anion gap: 7 (ref 5–15)
BUN: 5 mg/dL — ABNORMAL LOW (ref 6–20)
CO2: 22 mmol/L (ref 22–32)
Calcium: 8.1 mg/dL — ABNORMAL LOW (ref 8.9–10.3)
Chloride: 105 mmol/L (ref 98–111)
Creatinine, Ser: 0.56 mg/dL (ref 0.44–1.00)
GFR, Estimated: >60 mL/min (ref >60)
Glucose, Bld: 74 mg/dL (ref 70–99)
Potassium: 3.3 mmol/L — ABNORMAL LOW (ref 3.5–5.1)
Sodium: 134 mmol/L — ABNORMAL LOW (ref 135–145)
Total Bilirubin: 0.5 mg/dL (ref 0.3–1.2)
Total Protein: 5.7 g/dL — ABNORMAL LOW (ref 6.5–8.1)

## 2023-03-22 LAB — CBC
HCT: 24.6 % — ABNORMAL LOW (ref 36.0–46.0)
Hemoglobin: 8 g/dL — ABNORMAL LOW (ref 12.0–15.0)
MCH: 25.7 pg — ABNORMAL LOW (ref 26.0–34.0)
MCHC: 32.5 g/dL (ref 30.0–36.0)
MCV: 79.1 fL — ABNORMAL LOW (ref 80.0–100.0)
Platelets: 151 10*3/uL (ref 150–400)
RBC: 3.11 MIL/uL — ABNORMAL LOW (ref 3.87–5.11)
RDW: 14.5 % (ref 11.5–15.5)
WBC: 4 10*3/uL (ref 4.0–10.5)
nRBC: 1 % — ABNORMAL HIGH (ref 0.0–0.2)

## 2023-03-22 LAB — CHLAMYDIA/NGC RT PCR (ARMC ONLY)
Chlamydia Tr: NOT DETECTED
N gonorrhoeae: NOT DETECTED

## 2023-03-22 MED ORDER — EPINEPHRINE PF 1 MG/ML IJ SOLN: 0.3000 mg | Freq: Once | INTRAMUSCULAR | Status: DC | PRN

## 2023-03-22 MED ORDER — DIPHENHYDRAMINE HCL 50 MG/ML IJ SOLN: 25.0000 mg | Freq: Once | INTRAMUSCULAR | Status: DC | PRN

## 2023-03-22 MED ORDER — SODIUM CHLORIDE 0.9 % IV SOLN: INTRAVENOUS | Status: DC | PRN

## 2023-03-22 MED ORDER — ALBUTEROL SULFATE (2.5 MG/3ML) 0.083% IN NEBU: 2.5000 mg | INHALATION_SOLUTION | Freq: Once | RESPIRATORY_TRACT | Status: DC | PRN

## 2023-03-22 MED ORDER — METHYLPREDNISOLONE SODIUM SUCC 125 MG IJ SOLR: 125.0000 mg | Freq: Once | INTRAMUSCULAR | Status: DC | PRN

## 2023-03-22 MED ORDER — SODIUM CHLORIDE 0.9 % IV BOLUS: 500.0000 mL | Freq: Once | INTRAVENOUS | Status: DC | PRN

## 2023-03-22 MED ORDER — IRON SUCROSE 500 MG IVPB - SIMPLE MED
500.0000 mg | Freq: Once | INTRAVENOUS | Status: AC
  Administered 2023-03-22: 500 mg via INTRAVENOUS
  Filled 2023-03-22: qty 25

## 2023-03-22 NOTE — Telephone Encounter (Signed)
Chart reviewed. Pt seen at ED.

## 2023-03-22 NOTE — Discharge Summary (Signed)
LABOR & DELIVERY OB TRIAGE NOTE  SUBJECTIVE  HPI Madison Nichols is a 22 y.o. G3P2002 at [redacted]w[redacted]d who presented to Labor & Delivery for headache, fever at home of 100.8, chills. All urinary symptoms resolved. Remains in OBS overnight for IV iron infusion, this morning denies headache, feeling feverish/chills and endorses feeling better this morning.  OB History     Gravida  3   Para  2   Term  2   Preterm      AB      Living  2      SAB      IAB      Ectopic      Multiple  0   Live Births  2           Scheduled Meds: Continuous Infusions:  sodium chloride     sodium chloride     PRN Meds:.sodium chloride, acetaminophen, albuterol, diphenhydrAMINE, EPINEPHrine, methylPREDNISolone (SOLU-MEDROL) injection, ondansetron, sodium chloride, sodium citrate-citric acid  OBJECTIVE  BP (!) 93/52 (BP Location: Left Arm)   Pulse 90   Temp 98.2 F (36.8 C) (Oral)   Resp 16   Ht 4\' 11"  (1.499 m)   Wt 56 kg   LMP 07/18/2022 (Exact Date)   BMI 24.94 kg/m   NST I reviewed the NST and it was reactive.   Baseline: 150 Variability: moderate Accelerations: present Decelerations:none Toco: occasional, irregular Category I  ASSESSMENT Impression  1) Pregnancy at G3P2002, [redacted]w[redacted]d, Estimated Date of Delivery: 04/29/23 2) Reassuring maternal/fetal status 3) Iron deficiency anemia  PLAN Iron infusion of Venofer complete. Kerriann reports all symptoms improved. Remains afebrile. Preterm labor, fetal movement & infection precautions reviewed. Plan repeat Venofer infusion next week as outpatient. Maintain next visit as scheduled.  Dominica Severin, CNM 03/22/23  7:13 AM

## 2023-03-22 NOTE — OB Triage Note (Signed)
Discharge instructions, labor precautions, and follow-up care reviewed with patient and significant other. All questions answered. Patient verbalized understanding. Discharged ambulatory off unit.  

## 2023-03-23 LAB — URINE CULTURE: Culture: <10000 — AB

## 2023-03-26 ENCOUNTER — Ambulatory Visit (INDEPENDENT_AMBULATORY_CARE_PROVIDER_SITE_OTHER): Payer: BLUE CROSS/BLUE SHIELD | Admitting: Certified Nurse Midwife

## 2023-03-26 VITALS — BP 107/71 | HR 101 | Wt 130.7 lb

## 2023-03-26 DIAGNOSIS — Z3483 Encounter for supervision of other normal pregnancy, third trimester: Secondary | ICD-10-CM

## 2023-03-26 DIAGNOSIS — Z3A35 35 weeks gestation of pregnancy: Secondary | ICD-10-CM

## 2023-03-26 LAB — POCT URINALYSIS DIPSTICK OB
Bilirubin, UA: NEGATIVE
Blood, UA: NEGATIVE
Leukocytes, UA: NEGATIVE
Nitrite, UA: POSITIVE — AB
Spec Grav, UA: 1.015 (ref 1.010–1.025)
Urobilinogen, UA: 0.2 U/dL
pH, UA: 6.5 (ref 5.0–8.0)

## 2023-03-26 NOTE — Patient Instructions (Signed)
Group B Streptococcus Infection During Pregnancy Group B Streptococcus (GBS) is a type of bacteria that is often found in healthy people. It is commonly found in the rectum, vagina, and intestines. In people who are healthy and not pregnant, the bacteria rarely cause serious illness or complications. However, women who test positive for GBS during pregnancy can pass the bacteria to the baby during childbirth. This can cause serious infection in the baby after birth. Women with GBS may also have infections during their pregnancy or soon after childbirth. The infections include urinary tract infections (UTIs) or infections of the uterus. GBS also increases a woman's risk of complications during pregnancy, such as early labor or delivery, miscarriage, or stillbirth. Routine testing for GBS is recommended for all pregnant women. What are the causes? This condition is caused by bacteria called Streptococcus agalactiae. What increases the risk? You may have a higher risk for GBS infection during pregnancy if you had one during a past pregnancy. What are the signs or symptoms? In most cases, GBS infection does not cause symptoms in pregnant women. If symptoms exist, they may include: Labor that starts before the 37th week of pregnancy. A UTI or bladder infection. This may cause a fever, frequent urination, or pain and burning during urination. Fever during labor. There can also be a rapid heartbeat in the mother or baby. Rare but serious symptoms of a GBS infection in women include: Blood infection (septicemia). This may cause fever, chills, or confusion. Lung infection (pneumonia). This may cause fever, chills, cough, rapid breathing, chest pain, or difficulty breathing. Bone, joint, skin, or soft tissue infection. How is this diagnosed? You may be screened for GBS between week 35 and week 37 of pregnancy. If you have symptoms of preterm labor, you may be screened earlier. This condition is diagnosed  based on lab test results from: A swab of fluid from the vagina and rectum. A urine sample. How is this treated? This condition is treated with antibiotic medicine. Antibiotic medicine may be given: To you when you go into labor, or as soon as your water breaks. The medicines will continue until after you give birth. If you are having a cesarean delivery, you do not need antibiotics unless your water has broken. To your baby, if he or she requires treatment. Your health care provider will check your baby to decide if he or she needs antibiotics to prevent a serious infection. Follow these instructions at home: Take over-the-counter and prescription medicines only as told by your health care provider. Take your antibiotic medicine as told by your health care provider. Do not stop taking the antibiotic even if you start to feel better. Keep all pre-birth (prenatal) visits and follow-up visits as told by your health care provider. This is important. Contact a health care provider if: You have pain or burning when you urinate. You have to urinate more often than usual. You have a fever or chills. You develop a bad-smelling vaginal discharge. Get help right away if: Your water breaks. You go into labor. You have severe pain in your abdomen. You have difficulty breathing. You have chest pain. These symptoms may represent a serious problem that is an emergency. Do not wait to see if the symptoms will go away. Get medical help right away. Call your local emergency services (911 in the [place] Do not drive yourself to the hospital. Summary GBS is a type of bacteria that is common in healthy people. During pregnancy, colonization with GBS can cause  serious complications for you or your baby. Your health care provider will screen you between 35 and 37 weeks of pregnancy to determine if you are colonized with GBS. If you are colonized with GBS during pregnancy, your health care provider will recommend  antibiotics through an IV during labor. After delivery, your baby will be evaluated for complications related to potential GBS infection and may require antibiotics to prevent a serious infection. This information is not intended to replace advice given to you by your health care provider. Make sure you discuss any questions you have with your health care provider. Document Revised: 05/04/2022 Document Reviewed: 05/04/2022 Elsevier Patient Education  2024 [employer] Inc. Preterm Labor Pregnancy normally lasts 39-41 weeks. Preterm labor is when labor starts before you have been pregnant for 37 weeks. Babies who are born too early may have a higher risk for long-term problems like cerebral palsy or developmental delays. They may also have problems soon after birth, such as problems with blood sugar, body temperature, heart, and breathing. These problems may be very serious in babies who are born before 34 weeks of pregnancy. What are the causes? The cause of this condition is not known. What increases the risk? You are more likely to have preterm labor if: You have medical problems, now or in the past. You have problems now or in your past pregnancies. You have lifestyle problems. Medical history You have problems of the womb (uterus). You have an infection, including infections you get from sex. You have problems that do not go away, such as: Blood clots. High blood pressure. High blood sugar. You have low body weight or too much body weight. Present and past pregnancies You have had preterm labor before. You are pregnant with two babies or more. You have a condition in which the placenta covers your cervix. You waited less than 18 months between giving birth and becoming pregnant again. Your unborn baby has some problems. You have bleeding from your vagina. You became pregnant by a method called IVF. Lifestyle You smoke. You drink alcohol. You use drugs. You have stress. You have  abuse in your home. You come in contact with chemicals that harm the body (pollutants). Other factors You are younger than 17 years or older than 35 years. What are the signs or symptoms? Symptoms of this condition include: Cramps. The cramps may feel like cramps from a period. You may also have watery poop (diarrhea). Pain in the belly (abdomen). Pain in the lower back. Regular contractions. It may feel like your belly is getting tighter. Pressure in the lower belly. More fluid leaking from the vagina. The fluid may be watery or bloody. Water breaking. How is this treated? Treatment for this condition depends on your health, the health of your baby, and how old your pregnancy is. It may include: Taking medicines, such as: Hormone medicines. Medicines to stop contractions. Medicines to help mature the baby's lungs. Medicines to prevent your baby from getting cerebral palsy or other problems. Bed rest. If the labor happens before 34 weeks of pregnancy, you may need to stay in the hospital. Delivering the baby. Follow these instructions at home:  Do not smoke or use any products that contain nicotine or tobacco. If you need help quitting, ask your doctor. Do not drink alcohol. Take over-the-counter and prescription medicines only as told by your doctor. Rest as told by your doctor. Return to your normal activities when your doctor says that it is safe. Keep all follow-up visits.  How is this prevented? To have a healthy pregnancy: Do not use drugs. Do not use any medicines unless you ask your doctor if they are safe for you. Talk with your doctor before taking any herbal supplements. Make sure you gain enough weight. Watch for infection. If you think you might have an infection, get it checked right away. Symptoms of infection may include: Fever. Vaginal discharge that smells bad or is not normal. Pain or burning when you pee. Needing to pee urgently. Needing to pee  often. Peeing small amounts often. Blood in your pee. Pee that smells bad or unusual. Where to find more information [place] Department of Health and Cyt[employer]ist on Women's Health: http://hoffman.com/ [employer]: www.acog.org Centers for Disease Control and Prevention: FootballExhibition.com.br Contact a doctor if: You think you are going into preterm labor. You have symptoms of preterm labor. You have symptoms of infection. Get help right away if: You are having painful contractions every 5 minutes or less. Your water breaks. Summary Preterm labor is labor that starts before you reach 37 weeks of pregnancy. Your baby may have problems if delivered early. You are more likely to have preterm labor if you have certain medical problems or problems with a pregnancy now or in the past. Some lifestyle factors can also increase the risk. Contact a doctor if you have symptoms of preterm labor. This information is not intended to replace advice given to you by your health care provider. Make sure you discuss any questions you have with your health care provider. Document Revised: 05/17/2020 Document Reviewed: 05/21/2020 Elsevier Patient Education  2024 [employer].

## 2023-03-26 NOTE — Progress Notes (Signed)
ROB doing well, feeling good movement. Discussed GBS testing next visit. She verbalizes understanding. Urine dip positive for nitrates, pt currently being treated for UTI. She denies any symptoms. Discussed TOC culture . She is in agreement. Follow up 1 wk.   Doreene Burke, CNM

## 2023-03-29 ENCOUNTER — Other Ambulatory Visit: Payer: Self-pay | Admitting: Certified Nurse Midwife

## 2023-03-31 ENCOUNTER — Ambulatory Visit
Admission: RE | Admit: 2023-03-31 | Discharge: 2023-03-31 | Disposition: A | Discharge status: Home / Self Care | Payer: BLUE CROSS/BLUE SHIELD | Source: Ambulatory Visit | Attending: Certified Nurse Midwife | Admitting: Certified Nurse Midwife

## 2023-03-31 DIAGNOSIS — D509 Iron deficiency anemia, unspecified: Secondary | ICD-10-CM | POA: Insufficient documentation

## 2023-03-31 MED ORDER — IRON SUCROSE 500 MG IVPB - SIMPLE MED
500.0000 mg | Freq: Once | INTRAVENOUS | Status: AC
  Administered 2023-03-31: 500 mg via INTRAVENOUS
  Filled 2023-03-31: qty 275

## 2023-04-02 ENCOUNTER — Encounter: Payer: BLUE CROSS/BLUE SHIELD | Admitting: Obstetrics

## 2023-04-02 ENCOUNTER — Ambulatory Visit (INDEPENDENT_AMBULATORY_CARE_PROVIDER_SITE_OTHER): Payer: BLUE CROSS/BLUE SHIELD | Admitting: Certified Nurse Midwife

## 2023-04-02 ENCOUNTER — Other Ambulatory Visit (HOSPITAL_COMMUNITY)
Admission: RE | Admit: 2023-04-02 | Discharge: 2023-04-02 | Disposition: A | Discharge status: Home / Self Care | Payer: BLUE CROSS/BLUE SHIELD | Source: Ambulatory Visit | Attending: Certified Nurse Midwife | Admitting: Certified Nurse Midwife

## 2023-04-02 VITALS — BP 114/78 | HR 86 | Wt 128.8 lb

## 2023-04-02 DIAGNOSIS — Z113 Encounter for screening for infections with a predominantly sexual mode of transmission: Secondary | ICD-10-CM

## 2023-04-02 DIAGNOSIS — Z3483 Encounter for supervision of other normal pregnancy, third trimester: Secondary | ICD-10-CM

## 2023-04-02 DIAGNOSIS — Z3A36 36 weeks gestation of pregnancy: Secondary | ICD-10-CM | POA: Insufficient documentation

## 2023-04-02 DIAGNOSIS — Z3685 Encounter for antenatal screening for Streptococcus B: Secondary | ICD-10-CM

## 2023-04-02 LAB — POCT URINALYSIS DIPSTICK OB
Bilirubin, UA: NEGATIVE
Blood, UA: NEGATIVE
Glucose, UA: NEGATIVE
Ketones, UA: NEGATIVE
Leukocytes, UA: NEGATIVE
Nitrite, UA: NEGATIVE
POC,PROTEIN,UA: NEGATIVE
Spec Grav, UA: 1.01 (ref 1.010–1.025)
Urobilinogen, UA: 0.2 U/dL
pH, UA: 6.5 (ref 5.0–8.0)

## 2023-04-02 NOTE — Patient Instructions (Signed)
Third Trimester of Pregnancy  The third trimester of pregnancy is from week 28 through week 40. This is months 7 through 9. The third trimester is a time when the unborn baby (fetus) is growing rapidly. At the end of the ninth month, the fetus is about 20 inches long and weighs 6-10 pounds. Body changes during your third trimester During the third trimester, your body will continue to go through many changes. The changes vary and generally return to normal after your baby is born. Physical changes Your weight will continue to increase. You can expect to gain 25-35 pounds (11-16 kg) by the end of the pregnancy if you begin pregnancy at a normal weight. If you are underweight, you can expect to gain 28-40 lb (about 13-18 kg), and if you are overweight, you can expect to gain 15-25 lb (about 7-11 kg). You may begin to get stretch marks on your hips, abdomen, and breasts. Your breasts will continue to grow and may hurt. A yellow fluid (colostrum) may leak from your breasts. This is the first milk you are producing for your baby. You may have changes in your hair. These can include thickening of your hair, rapid growth, and changes in texture. Some people also have hair loss during or after pregnancy, or hair that feels dry or thin. Your belly button may stick out. You may notice more swelling in your hands, face, or ankles. Health changes You may have heartburn. You may have constipation. You may develop hemorrhoids. You may develop swollen, bulging veins (varicose veins) in your legs. You may have increased body aches in the pelvis, back, or thighs. This is due to weight gain and increased hormones that are relaxing your joints. You may have increased tingling or numbness in your hands, arms, and legs. The skin on your abdomen may also feel numb. You may feel short of breath because of your expanding uterus. Other changes You may urinate more often because the fetus is moving lower into your pelvis  and pressing on your bladder. You may have more problems sleeping. This may be caused by the size of your abdomen, an increased need to urinate, and an increase in your body's metabolism. You may notice the fetus "dropping," or moving lower in your abdomen (lightening). You may have increased vaginal discharge. You may notice that you have pain around your pelvic bone as your uterus distends. Follow these instructions at home: Medicines Follow your health care provider's instructions regarding medicine use. Specific medicines may be either safe or unsafe to take during pregnancy. Do not take any medicines unless approved by your health care provider. Take a prenatal vitamin that contains at least 600 micrograms (mcg) of folic acid. Eating and drinking Eat a healthy diet that includes fresh fruits and vegetables, whole grains, good sources of protein such as meat, eggs, or tofu, and low-fat dairy products. Avoid raw meat and unpasteurized juice, milk, and cheese. These carry germs that can harm you and your baby. Eat 4 or 5 small meals rather than 3 large meals a day. You may need to take these actions to prevent or treat constipation: Drink enough fluid to keep your urine pale yellow. Eat foods that are high in fiber, such as beans, whole grains, and fresh fruits and vegetables. Limit foods that are high in fat and processed sugars, such as fried or sweet foods. Activity Exercise only as directed by your health care provider. Most people can continue their usual exercise routine during pregnancy. Try   to exercise for 30 minutes at least 5 days a week. Stop exercising if you experience contractions in the uterus. Stop exercising if you develop pain or cramping in the lower abdomen or lower back. Avoid heavy lifting. Do not exercise if it is very hot or humid or if you are at a high altitude. If you choose to, you may continue to have sex unless your health care provider tells you not  to. Relieving pain and discomfort Take frequent breaks and rest with your legs raised (elevated) if you have leg cramps or low back pain. Take warm sitz baths to soothe any pain or discomfort caused by hemorrhoids. Use hemorrhoid cream if your health care provider approves. Wear a supportive bra to prevent discomfort from breast tenderness. If you develop varicose veins: Wear support hose as told by your health care provider. Elevate your feet for 15 minutes, 3-4 times a day. Limit salt in your diet. Safety Talk to your health care provider before traveling far distances. Do not use hot tubs, steam rooms, or saunas. Wear your seat belt at all times when driving or riding in a car. Talk with your health care provider if someone is verbally or physically abusive to you. Preparing for birth To prepare for the arrival of your baby: Take prenatal classes to understand, practice, and ask questions about labor and delivery. Visit the hospital and tour the maternity area. Purchase a rear-facing car seat and make sure you know how to install it in your car. Prepare the baby's room or sleeping area. Make sure to remove all pillows and stuffed animals from the baby's crib to prevent suffocation. General instructions Avoid cat litter boxes and soil used by cats. These carry germs that can cause birth defects in the baby. If you have a cat, ask someone to clean the litter box for you. Do not douche or use tampons. Do not use scented sanitary pads. Do not use any products that contain nicotine or tobacco, such as cigarettes, e-cigarettes, and chewing tobacco. If you need help quitting, ask your health care provider. Do not use any herbal remedies, illegal drugs, or medicines that were not prescribed to you. Chemicals in these products can harm your baby. Do not drink alcohol. You will have more frequent prenatal exams during the third trimester. During a routine prenatal visit, your health care provider  will do a physical exam, perform tests, and discuss your overall health. Keep all follow-up visits. This is important. Where to find more information American Pregnancy Association: americanpregnancy.org American College of Obstetricians and Gynecologists: acog.org/en/Womens%20Health/Pregnancy Office on Women's Health: womenshealth.gov/pregnancy Contact a health care provider if you have: A fever. Mild pelvic cramps, pelvic pressure, or nagging pain in your abdominal area or lower back. Vomiting or diarrhea. Bad-smelling vaginal discharge or foul-smelling urine. Pain when you urinate. A headache that does not go away when you take medicine. Visual changes or see spots in front of your eyes. Get help right away if: Your water breaks. You have regular contractions less than 5 minutes apart. You have spotting or bleeding from your vagina. You have severe abdominal pain. You have difficulty breathing. You have chest pain. You have fainting spells. You have not felt your baby move for the time period told by your health care provider. You have new or increased pain, swelling, or redness in an arm or leg. Summary The third trimester of pregnancy is from week 28 through week 40 (months 7 through 9). You may have more problems   sleeping. This can be caused by the size of your abdomen, an increased need to urinate, and an increase in your body's metabolism. You will have more frequent prenatal exams during the third trimester. Keep all follow-up visits. This is important. This information is not intended to replace advice given to you by your health care provider. Make sure you discuss any questions you have with your health care provider. Document Revised: 10/25/2019 Document Reviewed: 08/31/2019 Elsevier Patient Education  2024 Elsevier Inc.  Group B Streptococcus Infection During Pregnancy Group B Streptococcus (GBS) is a type of bacteria that is often found in healthy people. It is  commonly found in the rectum, vagina, and intestines. In people who are healthy and not pregnant, the bacteria rarely cause serious illness or complications. However, women who test positive for GBS during pregnancy can pass the bacteria to the baby during childbirth. This can cause serious infection in the baby after birth. Women with GBS may also have infections during their pregnancy or soon after childbirth. The infections include urinary tract infections (UTIs) or infections of the uterus. GBS also increases a woman's risk of complications during pregnancy, such as early labor or delivery, miscarriage, or stillbirth. Routine testing for GBS is recommended for all pregnant women. What are the causes? This condition is caused by bacteria called Streptococcus agalactiae. What increases the risk? You may have a higher risk for GBS infection during pregnancy if you had one during a past pregnancy. What are the signs or symptoms? In most cases, GBS infection does not cause symptoms in pregnant women. If symptoms exist, they may include: Labor that starts before the 37th week of pregnancy. A UTI or bladder infection. This may cause a fever, frequent urination, or pain and burning during urination. Fever during labor. There can also be a rapid heartbeat in the mother or baby. Rare but serious symptoms of a GBS infection in women include: Blood infection (septicemia). This may cause fever, chills, or confusion. Lung infection (pneumonia). This may cause fever, chills, cough, rapid breathing, chest pain, or difficulty breathing. Bone, joint, skin, or soft tissue infection. How is this diagnosed? You may be screened for GBS between week 35 and week 37 of pregnancy. If you have symptoms of preterm labor, you may be screened earlier. This condition is diagnosed based on lab test results from: A swab of fluid from the vagina and rectum. A urine sample. How is this treated? This condition is treated with  antibiotic medicine. Antibiotic medicine may be given: To you when you go into labor, or as soon as your water breaks. The medicines will continue until after you give birth. If you are having a cesarean delivery, you do not need antibiotics unless your water has broken. To your baby, if he or she requires treatment. Your health care provider will check your baby to decide if he or she needs antibiotics to prevent a serious infection. Follow these instructions at home: Take over-the-counter and prescription medicines only as told by your health care provider. Take your antibiotic medicine as told by your health care provider. Do not stop taking the antibiotic even if you start to feel better. Keep all pre-birth (prenatal) visits and follow-up visits as told by your health care provider. This is important. Contact a health care provider if: You have pain or burning when you urinate. You have to urinate more often than usual. You have a fever or chills. You develop a bad-smelling vaginal discharge. Get help right away if:   Your water breaks. You go into labor. You have severe pain in your abdomen. You have difficulty breathing. You have chest pain. These symptoms may represent a serious problem that is an emergency. Do not wait to see if the symptoms will go away. Get medical help right away. Call your local emergency services (911 in the [place] Do not drive yourself to the hospital. Summary GBS is a type of bacteria that is common in healthy people. During pregnancy, colonization with GBS can cause serious complications for you or your baby. Your health care provider will screen you between 35 and 37 weeks of pregnancy to determine if you are colonized with GBS. If you are colonized with GBS during pregnancy, your health care provider will recommend antibiotics through an IV during labor. After delivery, your baby will be evaluated for complications related to potential GBS infection and may  require antibiotics to prevent a serious infection. This information is not intended to replace advice given to you by your health care provider. Make sure you discuss any questions you have with your health care provider. Document Revised: 05/04/2022 Document Reviewed: 05/04/2022 Elsevier Patient Education  2024 Elsevier Inc.  

## 2023-04-02 NOTE — Progress Notes (Signed)
    Return Prenatal Note   Subjective   22 y.o. [id number] at [redacted]w[redacted]d presents for this follow-up prenatal visit.  Patient feeling well, had 2nd Fe infusion this week, experienced swollen feet after which resolved with rest & elevation, likely dependent edema given seated for 3-4h with feet on floor during infusion. Patient reports: Movement: Present Contractions: Not present  Objective   Flow sheet Vitals: Pulse Rate: 86 BP: 114/78 Fundal Height: 36 cm Fetal Heart Rate (bpm): 145 Presentation: Vertex (confirmed on BSUS) Total weight gain: 16 lb 12.8 oz (7.62 kg)  General Appearance  No acute distress, well appearing, and well nourished Pulmonary   Normal work of breathing Neurologic   Alert and oriented to person, place, and time Psychiatric   Mood and affect within normal limits  Assessment/Plan   Plan  22 y.o. [id number] at [redacted]w[redacted]d presents for follow-up OB visit. Reviewed prenatal record including previous visit note.  1. Encounter for supervision of other normal pregnancy in third trimester - Cervicovaginal ancillary only - Strep Gp B NAA - POC Urinalysis Dipstick OB  2. Screening examination for STD (sexually transmitted disease) - Cervicovaginal ancillary only  3. Antenatal screening for streptococcus B - Strep Gp B NAA  4. [redacted] weeks gestation of pregnancy - Cervicovaginal ancillary only - Strep Gp B NAA - POC Urinalysis Dipstick OB     Orders Placed This Encounter  Procedures   Strep Gp B NAA   POC Urinalysis Dipstick OB   No follow-ups on file.   Future Appointments  Date Time Provider Department Center  04/09/2023  1:15 PM Dominica Severin, CNM [facility] None  04/16/2023  1:15 PM Free, Lindalou Hose, CNM [facility] None  04/23/2023  1:15 PM Doreene Burke, CNM [facility] None    For next visit:  continue with routine prenatal care.     Dominica Severin, CNM  11/01/244:47 PM

## 2023-04-04 LAB — STREP GP B NAA: Strep Gp B NAA: NEGATIVE

## 2023-04-06 LAB — CERVICOVAGINAL ANCILLARY ONLY
Chlamydia: NEGATIVE
Comment: NEGATIVE
Comment: NORMAL
Neisseria Gonorrhea: NEGATIVE

## 2023-04-09 ENCOUNTER — Ambulatory Visit (INDEPENDENT_AMBULATORY_CARE_PROVIDER_SITE_OTHER): Payer: BLUE CROSS/BLUE SHIELD | Admitting: Certified Nurse Midwife

## 2023-04-09 VITALS — BP 105/70 | HR 94 | Wt 132.0 lb

## 2023-04-09 DIAGNOSIS — Z3480 Encounter for supervision of other normal pregnancy, unspecified trimester: Secondary | ICD-10-CM

## 2023-04-09 DIAGNOSIS — D508 Other iron deficiency anemias: Secondary | ICD-10-CM

## 2023-04-09 DIAGNOSIS — Z3A37 37 weeks gestation of pregnancy: Secondary | ICD-10-CM

## 2023-04-09 LAB — POCT URINALYSIS DIPSTICK OB
Bilirubin, UA: NEGATIVE
Blood, UA: NEGATIVE
Glucose, UA: NEGATIVE
Ketones, UA: NEGATIVE
Nitrite, UA: NEGATIVE
Spec Grav, UA: 1.02 (ref 1.010–1.025)
Urobilinogen, UA: 0.2 U/dL
pH, UA: 6 (ref 5.0–8.0)

## 2023-04-09 NOTE — Progress Notes (Signed)
    Return Prenatal Note   Subjective   22 y.o. [id number] at [redacted]w[redacted]d presents for this follow-up prenatal visit.  Patient feeling well, increased pressure & hip pain the last few days. Ready for labor! Requests SVE today.  Leuks on urine dip, culture sent for TOC/assess for continued infection, antibiotics completed. Patient reports: Movement: Present Contractions: Irritability  Objective   Flow sheet Vitals: Pulse Rate: 94 BP: 105/70 Fundal Height: 36 cm Fetal Heart Rate (bpm): 135 Presentation: Vertex Dilation: 3 Effacement (%): 50 Station: -2 Total weight gain: 20 lb (9.072 kg)  General Appearance  No acute distress, well appearing, and well nourished Pulmonary   Normal work of breathing Neurologic   Alert and oriented to person, place, and time Psychiatric   Mood and affect within normal limits  Assessment/Plan   Plan  22 y.o. [id number] at [redacted]w[redacted]d presents for follow-up OB visit. Reviewed prenatal record including previous visit note.  1. Supervision of other normal pregnancy, antepartum - POC Urinalysis Dipstick OB - Urine Culture  2. Other iron deficiency anemia - POC Urinalysis Dipstick OB - Urine Culture  3. [redacted] weeks gestation of pregnancy - POC Urinalysis Dipstick OB - Urine Culture  Labor & call signs reinforced.   Orders Placed This Encounter  Procedures   Urine Culture   POC Urinalysis Dipstick OB   Return in 1 week (on 04/16/2023) for ROB.   Future Appointments  Date Time Provider Department Center  04/16/2023  1:15 PM Free, Lindalou Hose, CNM [facility] None  04/23/2023  1:15 PM Doreene Burke, CNM [facility] None    For next visit:  continue with routine prenatal care     Dominica Severin, CNM  11/08/242:54 PM

## 2023-04-09 NOTE — Patient Instructions (Signed)

## 2023-04-12 LAB — URINE CULTURE

## 2023-04-13 ENCOUNTER — Ambulatory Visit (INDEPENDENT_AMBULATORY_CARE_PROVIDER_SITE_OTHER): Payer: BLUE CROSS/BLUE SHIELD | Admitting: Certified Nurse Midwife

## 2023-04-13 ENCOUNTER — Other Ambulatory Visit: Payer: Self-pay | Admitting: Certified Nurse Midwife

## 2023-04-13 VITALS — BP 110/75 | HR 80 | Wt 131.0 lb

## 2023-04-13 DIAGNOSIS — Z3A37 37 weeks gestation of pregnancy: Secondary | ICD-10-CM

## 2023-04-13 MED ORDER — CEPHALEXIN 500 MG PO CAPS
500.0000 mg | ORAL_CAPSULE | Freq: Four times a day (QID) | ORAL | 2 refills | Status: DC
Start: 2023-04-13 — End: 2023-04-19

## 2023-04-13 NOTE — Progress Notes (Signed)
ROB doing well, feeling good movement. Request SVE 3-4/60/-2 vertex. Labor precautions reviewed. Follow up 1 wk.   Doreene Burke, CNM

## 2023-04-13 NOTE — Patient Instructions (Signed)
.  Braxton Hicks Contractions  Contractions of the uterus can occur throughout pregnancy, but they are not always a sign that you are in labor. You may have practice contractions called Braxton Hicks contractions. These false labor contractions are sometimes confused with true labor. What are Braxton Hicks contractions? Braxton Hicks contractions are tightening movements that occur in the muscles of the uterus before labor. Unlike true labor contractions, these contractions do not result in opening (dilation) and thinning of the lowest part of the uterus (cervix). Toward the end of pregnancy (32-34 weeks), Braxton Hicks contractions can happen more often and may become stronger. These contractions are sometimes difficult to tell apart from true labor because they can be very uncomfortable. How to tell the difference between true labor and false labor True labor Contractions last 30-70 seconds. Contractions become very regular. Discomfort is usually felt in the top of the uterus, and it spreads to the lower abdomen and low back. Contractions do not go away with walking. Contractions usually become stronger and more frequent. The cervix dilates and gets thinner. False labor Contractions are usually shorter, weaker, and farther apart than true labor contractions. Contractions are usually irregular. Contractions are often felt in the front of the lower abdomen and in the groin. Contractions may go away when you walk around or change positions while lying down. The cervix usually does not dilate or become thin. Sometimes, the only way to tell if you are in true labor is for your health care provider to look for changes in your cervix. Your health care provider will do a physical exam and may monitor your contractions. If you are in true labor, your health care provider will send you home with instructions about when to return to the hospital. You may continue to have Braxton Hicks contractions until  you go into true labor. Follow these instructions at home:  Take over-the-counter and prescription medicines only as told by your health care provider. If Braxton Hicks contractions are making you uncomfortable: Change your position from lying down or resting to walking, or change from walking to resting. Sit and rest in a tub of warm water. Drink enough fluid to keep your urine pale yellow. Dehydration may cause these contractions. Do slow and deep breathing several times an hour. Keep all follow-up visits. This is important. Contact a health care provider if: You have a fever. You have continuous pain in your abdomen. Your contractions become stronger, more regular, and closer together. You pass blood-tinged mucus. Get help right away if: You have fluid leaking or gushing from your vagina. You have bright red blood coming from your vagina. Your baby is not moving inside you as much as it used to. Summary You may have practice contractions called Braxton Hicks contractions. These false labor contractions are sometimes confused with true labor. Braxton Hicks contractions are usually shorter, weaker, farther apart, and less regular than true labor contractions. True labor contractions usually become stronger, more regular, and more frequent. Manage discomfort from Braxton Hicks contractions by changing position, resting in a warm bath, practicing deep breathing, and drinking plenty of water. Keep all follow-up visits. Contact your health care provider if your contractions become stronger, more regular, and closer together. This information is not intended to replace advice given to you by your health care provider. Make sure you discuss any questions you have with your health care provider. Document Revised: 03/25/2020 Document Reviewed: 03/25/2020 Elsevier Patient Education  2024 [employer]  

## 2023-04-16 ENCOUNTER — Ambulatory Visit (INDEPENDENT_AMBULATORY_CARE_PROVIDER_SITE_OTHER): Payer: BLUE CROSS/BLUE SHIELD

## 2023-04-16 VITALS — BP 128/74 | HR 92 | Wt 133.5 lb

## 2023-04-16 DIAGNOSIS — Z3480 Encounter for supervision of other normal pregnancy, unspecified trimester: Secondary | ICD-10-CM

## 2023-04-16 NOTE — Progress Notes (Signed)
    Return Prenatal Note   Assessment/Plan   Plan  22 y.o. G3P2002 at [redacted]w[redacted]d presents for follow-up OB visit. Reviewed prenatal record including previous visit note.  Supervision of other normal pregnancy, antepartum - Desires cervical exam today, 4-5/70/-2. - Reviewed labor warning signs and expectations for birth. Instructed to call office or come to hospital with persistent headache, vision changes, regular contractions, leaking of fluid, decreased fetal movement or vaginal bleeding.   No orders of the defined types were placed in this encounter.  Return in about 1 week (around 04/23/2023) for ROB.   Future Appointments  Date Time Provider Department Center  04/20/2023  1:55 PM Linzie Collin, MD [facility] None  04/23/2023  1:15 PM Doreene Burke, CNM [facility] None    For next visit:  continue with routine prenatal care     Subjective   22 y.o. [id number] at [redacted]w[redacted]d presents for this follow-up prenatal visit.  Patient has no concerns. Feeling some cramping but no other signs of labor. Patient reports: Movement: Present Contractions: Irritability  Objective   Flow sheet Vitals: Pulse Rate: 92 BP: 128/74 Fundal Height: 37 cm Fetal Heart Rate (bpm): 120 Presentation: Vertex Dilation: 4.5 Effacement (%): 70 Station: -2 Total weight gain: 21 lb 8 oz (9.752 kg)  General Appearance  No acute distress, well appearing, and well nourished Pulmonary   Normal work of breathing Neurologic   Alert and oriented to person, place, and time Psychiatric   Mood and affect within normal limits  Lindalou Hose Blanche Gallien, CNM  11/15/241:19 PM

## 2023-04-16 NOTE — Assessment & Plan Note (Addendum)
-   Desires cervical exam today, 4-5/70/-2. - Reviewed labor warning signs and expectations for birth. Instructed to call office or come to hospital with persistent headache, vision changes, regular contractions, leaking of fluid, decreased fetal movement or vaginal bleeding.

## 2023-04-18 ENCOUNTER — Encounter: Payer: Self-pay | Admitting: Obstetrics

## 2023-04-18 ENCOUNTER — Other Ambulatory Visit: Payer: Self-pay

## 2023-04-18 ENCOUNTER — Inpatient Hospital Stay
Admission: EM | Admit: 2023-04-18 | Discharge: 2023-04-19 | DRG: 807 | Disposition: A | Discharge status: Home / Self Care | Payer: BLUE CROSS/BLUE SHIELD | Attending: Certified Nurse Midwife | Admitting: Certified Nurse Midwife

## 2023-04-18 DIAGNOSIS — N812 Incomplete uterovaginal prolapse: Secondary | ICD-10-CM | POA: Diagnosis present

## 2023-04-18 DIAGNOSIS — O9902 Anemia complicating childbirth: Secondary | ICD-10-CM | POA: Diagnosis present

## 2023-04-18 DIAGNOSIS — Z3A38 38 weeks gestation of pregnancy: Secondary | ICD-10-CM

## 2023-04-18 DIAGNOSIS — O3443 Maternal care for other abnormalities of cervix, third trimester: Principal | ICD-10-CM | POA: Diagnosis present

## 2023-04-18 DIAGNOSIS — O4202 Full-term premature rupture of membranes, onset of labor within 24 hours of rupture: Secondary | ICD-10-CM

## 2023-04-18 DIAGNOSIS — O2343 Unspecified infection of urinary tract in pregnancy, third trimester: Secondary | ICD-10-CM

## 2023-04-18 DIAGNOSIS — Z87440 Personal history of urinary (tract) infections: Secondary | ICD-10-CM | POA: Diagnosis not present

## 2023-04-18 DIAGNOSIS — D649 Anemia, unspecified: Secondary | ICD-10-CM | POA: Diagnosis not present

## 2023-04-18 DIAGNOSIS — Z23 Encounter for immunization: Secondary | ICD-10-CM

## 2023-04-18 DIAGNOSIS — O26893 Other specified pregnancy related conditions, third trimester: Secondary | ICD-10-CM | POA: Diagnosis present

## 2023-04-18 LAB — CBC
HCT: 30.6 % — ABNORMAL LOW (ref 36.0–46.0)
Hemoglobin: 9.9 g/dL — ABNORMAL LOW (ref 12.0–15.0)
MCH: 28.8 pg (ref 26.0–34.0)
MCHC: 32.4 g/dL (ref 30.0–36.0)
MCV: 89 fL (ref 80.0–100.0)
Platelets: 151 10*3/uL (ref 150–400)
RBC: 3.44 MIL/uL — ABNORMAL LOW (ref 3.87–5.11)
RDW: 23.1 % — ABNORMAL HIGH (ref 11.5–15.5)
WBC: 11.5 10*3/uL — ABNORMAL HIGH (ref 4.0–10.5)
nRBC: 0 % (ref 0.0–0.2)

## 2023-04-18 LAB — TYPE AND SCREEN
ABO/RH(D): O POS
Antibody Screen: NEGATIVE

## 2023-04-18 MED ORDER — AMMONIA AROMATIC IN INHA
RESPIRATORY_TRACT | Status: AC
  Filled 2023-04-18: qty 10

## 2023-04-18 MED ORDER — OXYTOCIN 10 UNIT/ML IJ SOLN
INTRAMUSCULAR | Status: AC
  Filled 2023-04-18: qty 2

## 2023-04-18 MED ORDER — FENTANYL CITRATE (PF) 100 MCG/2ML IJ SOLN
50.0000 ug | INTRAMUSCULAR | Status: DC | PRN
Start: 2023-04-18 — End: 2023-04-18

## 2023-04-18 MED ORDER — LACTATED RINGERS IV SOLN
500.0000 mL | INTRAVENOUS | Status: DC | PRN
Start: 2023-04-18 — End: 2023-04-18

## 2023-04-18 MED ORDER — OXYTOCIN-SODIUM CHLORIDE 30-0.9 UT/500ML-% IV SOLN: 2.5000 [IU]/h | INTRAVENOUS | Status: DC | PRN

## 2023-04-18 MED ORDER — COCONUT OIL OIL: 1.0000 | TOPICAL_OIL | Status: DC | PRN

## 2023-04-18 MED ORDER — PRENATAL MULTIVITAMIN CH
1.0000 | ORAL_TABLET | Freq: Every day | ORAL | Status: DC
  Administered 2023-04-18: 1 via ORAL
  Filled 2023-04-18: qty 1

## 2023-04-18 MED ORDER — FERROUS SULFATE 325 (65 FE) MG PO TABS
325.0000 mg | ORAL_TABLET | Freq: Two times a day (BID) | ORAL | Status: DC
  Administered 2023-04-18 – 2023-04-19 (×3): 325 mg via ORAL
  Filled 2023-04-18 (×3): qty 1

## 2023-04-18 MED ORDER — MEASLES, MUMPS & RUBELLA VAC IJ SOLR
0.5000 mL | Freq: Once | INTRAMUSCULAR | Status: AC
  Administered 2023-04-19: 0.5 mL via SUBCUTANEOUS
  Filled 2023-04-18 (×2): qty 0.5

## 2023-04-18 MED ORDER — DIPHENHYDRAMINE HCL 25 MG PO CAPS: 25.0000 mg | ORAL_CAPSULE | Freq: Four times a day (QID) | ORAL | Status: DC | PRN

## 2023-04-18 MED ORDER — METHYLERGONOVINE MALEATE 0.2 MG/ML IJ SOLN
INTRAMUSCULAR | Status: AC
  Filled 2023-04-18: qty 1

## 2023-04-18 MED ORDER — SIMETHICONE 80 MG PO CHEW: 80.0000 mg | CHEWABLE_TABLET | ORAL | Status: DC | PRN

## 2023-04-18 MED ORDER — OXYTOCIN-SODIUM CHLORIDE 30-0.9 UT/500ML-% IV SOLN
INTRAVENOUS | Status: AC
  Filled 2023-04-18: qty 500

## 2023-04-18 MED ORDER — MISOPROSTOL 200 MCG PO TABS
ORAL_TABLET | ORAL | Status: AC
  Administered 2023-04-18: 800 ug
  Filled 2023-04-18: qty 4

## 2023-04-18 MED ORDER — OXYCODONE-ACETAMINOPHEN 5-325 MG PO TABS
1.0000 | ORAL_TABLET | ORAL | Status: DC | PRN
Start: 2023-04-18 — End: 2023-04-19

## 2023-04-18 MED ORDER — IBUPROFEN 600 MG PO TABS
600.0000 mg | ORAL_TABLET | Freq: Four times a day (QID) | ORAL | Status: DC
  Administered 2023-04-18 – 2023-04-19 (×4): 600 mg via ORAL
  Filled 2023-04-18 (×4): qty 1

## 2023-04-18 MED ORDER — METHYLERGONOVINE MALEATE 0.2 MG PO TABS: 0.2000 mg | ORAL_TABLET | ORAL | Status: DC | PRN

## 2023-04-18 MED ORDER — LACTATED RINGERS IV SOLN: INTRAVENOUS | Status: DC

## 2023-04-18 MED ORDER — CALCIUM CARBONATE ANTACID 500 MG PO CHEW: 2.0000 | CHEWABLE_TABLET | ORAL | Status: DC | PRN

## 2023-04-18 MED ORDER — SOD CITRATE-CITRIC ACID 500-334 MG/5ML PO SOLN
30.0000 mL | ORAL | Status: DC | PRN
Start: 2023-04-18 — End: 2023-04-18

## 2023-04-18 MED ORDER — LIDOCAINE HCL (PF) 1 % IJ SOLN: 30.0000 mL | INTRAMUSCULAR | Status: DC | PRN

## 2023-04-18 MED ORDER — BENZOCAINE-MENTHOL 20-0.5 % EX AERO
1.0000 | INHALATION_SPRAY | CUTANEOUS | Status: DC | PRN
  Administered 2023-04-18: 1 via TOPICAL
  Filled 2023-04-18: qty 56

## 2023-04-18 MED ORDER — SOD CITRATE-CITRIC ACID 500-334 MG/5ML PO SOLN: 30.0000 mL | ORAL | Status: DC | PRN

## 2023-04-18 MED ORDER — ACETAMINOPHEN 325 MG PO TABS: 650.0000 mg | ORAL_TABLET | ORAL | Status: DC | PRN

## 2023-04-18 MED ORDER — METHYLERGONOVINE MALEATE 0.2 MG/ML IJ SOLN: 0.2000 mg | INTRAMUSCULAR | Status: DC | PRN

## 2023-04-18 MED ORDER — OXYTOCIN BOLUS FROM INFUSION
333.0000 mL | Freq: Once | INTRAVENOUS | Status: AC
  Administered 2023-04-18: 333 mL via INTRAVENOUS

## 2023-04-18 MED ORDER — METHYLERGONOVINE MALEATE 0.2 MG/ML IJ SOLN
0.2000 mg | Freq: Once | INTRAMUSCULAR | Status: AC
  Administered 2023-04-18: 0.2 mg via INTRAMUSCULAR

## 2023-04-18 MED ORDER — ACETAMINOPHEN 325 MG PO TABS
650.0000 mg | ORAL_TABLET | ORAL | Status: DC | PRN
Start: 2023-04-18 — End: 2023-04-18

## 2023-04-18 MED ORDER — LACTATED RINGERS IV SOLN: 500.0000 mL | INTRAVENOUS | Status: DC | PRN

## 2023-04-18 MED ORDER — ONDANSETRON HCL 4 MG/2ML IJ SOLN
4.0000 mg | Freq: Four times a day (QID) | INTRAMUSCULAR | Status: DC | PRN
Start: 2023-04-18 — End: 2023-04-18

## 2023-04-18 MED ORDER — OXYTOCIN-SODIUM CHLORIDE 30-0.9 UT/500ML-% IV SOLN: 2.5000 [IU]/h | INTRAVENOUS | Status: DC

## 2023-04-18 MED ORDER — LIDOCAINE HCL (PF) 1 % IJ SOLN
INTRAMUSCULAR | Status: AC
  Filled 2023-04-18: qty 30

## 2023-04-18 MED ORDER — DOCUSATE SODIUM 100 MG PO CAPS
100.0000 mg | ORAL_CAPSULE | Freq: Two times a day (BID) | ORAL | Status: DC
  Administered 2023-04-18 – 2023-04-19 (×2): 100 mg via ORAL
  Filled 2023-04-18 (×2): qty 1

## 2023-04-18 MED ORDER — HYDROXYZINE HCL 25 MG PO TABS: 50.0000 mg | ORAL_TABLET | Freq: Four times a day (QID) | ORAL | Status: DC | PRN

## 2023-04-18 NOTE — Discharge Summary (Signed)
Postpartum Discharge Summary  Date of Service updated***     Patient Name: Madison Nichols DOB: 2000/08/26 MRN: [id number]  Date of admission: 04/18/2023 Delivery date:04/18/2023 Delivering provider: Glenetta Borg Date of discharge: 04/18/2023  Admitting diagnosis: Labor and delivery, indication for care [O75.9] Intrauterine pregnancy: [redacted]w[redacted]d     Secondary diagnosis:  Active Problems:   Labor and delivery, indication for care  Additional problems: None    Discharge diagnosis: Term Pregnancy Delivered                                              Post partum procedures:{Postpartum procedures:23558} Augmentation: N/A Complications: None  Hospital course: Onset of Labor With Vaginal Delivery      22 y.o. yo G3P3003 at [redacted]w[redacted]d was admitted in Active Labor on 04/18/2023. Labor course was uncomplicated.  Membrane Rupture Time/Date: 2:50 AM,04/18/2023  Delivery Method:Vaginal, Spontaneous Operative Delivery:N/A Episiotomy: None Lacerations:  1st degree;Perineal Patient had a postpartum course complicated by ***.  She is ambulating, tolerating a regular diet, passing flatus, and urinating well. Patient is discharged home in stable condition on 04/18/23.  Newborn Data: Birth date:04/18/2023 Birth time:5:44 AM Gender:Female Living status:Living Apgars:8 ,9  Weight:   Magnesium Sulfate received: No BMZ received: No Rhophylac:N/A MMR: ordered postpartum T-DaP:Given prenatally Flu: Yes RSV Vaccine received: Yes Transfusion:{Transfusion received:30440034} Immunizations administered: Immunization History  Administered Date(s) Administered   Influenza, Seasonal, Injecte, Preservative Fre 02/16/2023   Influenza,inj,Quad PF,6+ Mos 05/14/2015, 05/14/2016   Meningococcal Conjugate 05/14/2016   Rsv, Bivalent, Protein Subunit Rsvpref,pf Verdis Frederickson) 03/12/2023   Tdap 09/08/2017, 02/05/2023    Physical exam  Vitals:   04/18/23 0430  BP: 118/74  Pulse: 97  Resp: 19  Temp:  98.2 F (36.8 C)  TempSrc: Oral  Weight: 59 kg  Height: 4\' 11"  (1.499 m)   General: {Exam; general:21111117} Lochia: {Desc; appropriate/inappropriate:30686::"appropriate"} Uterine Fundus: {Desc; firm/soft:30687} Incision: {Exam; incision:21111123} DVT Evaluation: {Exam; QIO:9629528} Labs: Lab Results  Component Value Date   WBC 4.0 03/21/2023   HGB 8.0 (L) 03/21/2023   HCT 24.6 (L) 03/21/2023   MCV 79.1 (L) 03/21/2023   PLT 151 03/21/2023      Latest Ref Rng & Units 03/21/2023   11:37 PM  CMP  Glucose 70 - 99 mg/dL 74   BUN 6 - 20 mg/dL 5   Creatinine 4.13 - 2.44 mg/dL 0.10   Sodium 272 - 536 mmol/L 134   Potassium 3.5 - 5.1 mmol/L 3.3   Chloride 98 - 111 mmol/L 105   CO2 22 - 32 mmol/L 22   Calcium 8.9 - 10.3 mg/dL 8.1   Total Protein 6.5 - 8.1 g/dL 5.7   Total Bilirubin 0.3 - 1.2 mg/dL 0.5   Alkaline Phos 38 - 126 U/L 108   AST 15 - 41 U/L 12   ALT 0 - 44 U/L 8    Edinburgh Score:    09/28/2022    2:29 PM  Edinburgh Postnatal Depression Scale Screening Tool  I have been able to laugh and see the funny side of things. 0  I have looked forward with enjoyment to things. 0  I have blamed myself unnecessarily when things went wrong. 0  I have been anxious or worried for no good reason. 0  I have felt scared or panicky for no good reason. 0  Things have been getting on top of me.  0  I have been so unhappy that I have had difficulty sleeping. 0  I have felt sad or miserable. 0  I have been so unhappy that I have been crying. 0  The thought of harming myself has occurred to me. 0  Edinburgh Postnatal Depression Scale Total 0      After visit meds:  Allergies as of 04/18/2023   No Known Allergies   Med Rec must be completed prior to using this [facility]***        Discharge home in stable condition Infant Feeding: {Baby feeding:23562} Infant Disposition:{CHL IP OB HOME WITH OZHYQM:57846} Discharge instruction: per After Visit Summary and Postpartum  booklet. Activity: Advance as tolerated. Pelvic rest for 6 weeks.  Diet: {OB diet:21111121} Anticipated Birth Control: {Birth Control:23956} Postpartum Appointment:{Outpatient follow up:23559} Additional Postpartum F/U: {PP Procedure:23957} Future Appointments: Future Appointments  Date Time Provider Department Center  04/20/2023  1:55 PM Linzie Collin, MD [facility] None  04/23/2023  1:15 PM Doreene Burke, CNM [facility] None   Follow up Visit:  Follow-up Information     Glenetta Borg, CNM. Schedule an appointment as soon as possible for a visit.   Specialty: Obstetrics Why: Video visit in 2 weeks PP office visit in 6 weeks Contact information: [address] [place] [address] [phone]                     04/18/2023 Glenetta Borg, CNM

## 2023-04-18 NOTE — OB Triage Note (Signed)
Madison Nichols 22 y.o. @GP  @[place]   presents to Labor & Delivery triage via wheelchair steered by ED staff reporting SROM and contractions every . SROM at 250am   and ctx started at 0300.  She states positive fetal movement. External FM and TOCO applied to non-tender abdomen. Initial FHR 120. Vital signs obtained and within normal limits. Patient oriented to care environment including call bell and bed control use. Guadlupe Spanish, CNM notified of patient's arrival.

## 2023-04-18 NOTE — H&P (Signed)
[facility] Labor & Delivery  History and Physical  ASSESSMENT AND PLAN   Madison Nichols is a 22 y.o. G3P2002 at [redacted]w[redacted]d with EDD: 04/29/2023, by Ultrasound admitted for labor. Her pregnancy has been complicated by anemia and recurrent UTI.  -Admit to L&D -Admission labs -Plans unmedicated birth -Fetal monitoring per protocol -Anticipate NSVD -Dr. Dalbert Garnet notified of admission  Fetal Status: - cephalic presentation by Leopold's - EFW: 6 lbs by Leopold's - FHT currently category 1   Labs/Immunizations: Prenatal labs and studies: ABO, Rh: O/Positive/-- (05/10 1521) Antibody: Negative (05/10 1521) Rubella: <0.90 (05/10 1521) Varicella: 321 (05/10 1521)  RPR: Non Reactive (09/06 0923)  HBsAg: Negative (05/10 1521)  HepC: Non Reactive (05/10 1521) HIV: Non Reactive (09/06 0923)  GBS: Negative/-- (11/01 1509)   TDAP: given prenatally Flu: given prenatally RSV: yes Postpartum Plan: - Feeding: Formula - Contraception: plans  IUD or OCPs - Prenatal Care Provider: [facility]     HPI   Chief Complaint: "My water broke"  Madison Nichols is a 22 y.o. G3P2002 at [redacted]w[redacted]d who presents for contractions and LOF. She reports that she had a gush of fluid around 0250 and contractions began shortly thereafter. She endorses good fetal movement.    Pregnancy Complications Patient Active Problem List   Diagnosis Date Noted   Labor and delivery, indication for care 04/18/2023   Back pain affecting pregnancy 03/21/2023   Placental abnormality 12/17/2022   Supervision of other normal pregnancy, antepartum 09/28/2022   Iron deficiency anemia 11/11/2017   UTI (urinary tract infection) 06/10/2017    Review of Systems A twelve point review of systems was negative except as stated in HPI.   HISTORY   Medications Medications Prior to Admission  Medication Sig Dispense Refill Last Dose   Prenatal Vit-Fe Fumarate-FA (MULTIVITAMIN-PRENATAL) 27-0.8 MG TABS tablet Take 1 tablet by  mouth daily at 12 noon.   04/18/2023   cephALEXin (KEFLEX) 500 MG capsule Take 1 capsule (500 mg total) by mouth 4 (four) times daily. (Patient not taking: Reported on 04/13/2023) 28 capsule 2    Ferric Maltol (ACCRUFER) 30 MG CAPS Take 1 capsule (30 mg total) by mouth 2 (two) times daily. Take 1h before or 2h after meals. (Patient not taking: Reported on 04/09/2023) 60 capsule 4    omeprazole (PRILOSEC) 10 MG capsule Take 1 capsule (10 mg total) by mouth daily. May increase to 20 mg daily after 2-4 weeks (Patient not taking: Reported on 04/09/2023) 30 capsule 0     Allergies has No Known Allergies.   OB History OB History  Gravida Para Term Preterm AB Living  3 2 2  0 0 2  SAB IAB Ectopic Multiple Live Births  0 0 0 0 2    # Outcome Date GA Lbr Len/2nd Weight Sex Type Anes PTL Lv  3 Current           2 Term 08/11/20 [redacted]w[redacted]d / 00:09 3010 g M Vag-Spont None  LIV     Name: Schnetzer,BOY [place]     Apgar1: 9  Apgar5: 9  1 Term 11/21/17 [redacted]w[redacted]d  2551 g M Vag-Spont None N LIV    Past Medical History Past Medical History:  Diagnosis Date   Iron deficiency anemia    NSVD (normal spontaneous vaginal delivery) 08/11/2020    Past Surgical History Past Surgical History:  Procedure Laterality Date   WISDOM TOOTH EXTRACTION  2023   [age]    Social History  reports that she has never smoked. She  has never used smokeless tobacco. She reports that she does not currently use alcohol. She reports that she does not use drugs.   Family History family history includes Healthy in her brother, brother, father, maternal grandfather, maternal grandmother, mother, paternal grandfather, and paternal grandmother.   PHYSICAL EXAM   There were no vitals filed for this visit.  Constitutional: No acute distress, well appearing, and well nourished. Neurologic: She is alert and conversational.  Psychiatric: She has a normal mood and affect.  Musculoskeletal: Normal gait, grossly normal range of  motion Cardiovascular: Normal rate.   Pulmonary/Chest: Normal work of breathing.  Gastrointestinal/Abdominal: Soft. Gravid. There is no tenderness.  Skin: Skin is warm and dry. No rash noted.  Genitourinary: Normal external female genitalia.  SVE:   Dilation: 7 Effacement (%): 80 Station: -1 Presentation: Vertex Exam by:: Exie Parody RN   NST Interpretation  Baseline: 115 bpm Variability: moderate Accelerations: present Decelerations: none Contractions: regular, every 2 minutes  Impression: reactive   Glenetta Borg, CNM

## 2023-04-19 LAB — RPR: RPR Ser Ql: NONREACTIVE

## 2023-04-19 NOTE — Final Progress Note (Signed)
Final Progress Note  Patient ID: Madison Nichols MRN: [id number] DOB/AGE: [birth year] 22 y.o.  Admit date: 04/18/2023 Admitting provider: Glenetta Borg, CNM Discharge date: 04/19/2023   Admission Diagnoses: labor and delivery indication for care  Discharge Diagnoses:  Active Problems:   Labor and delivery, indication for care    Consults: None  Significant Findings/ Diagnostic Studies: GBS neg  Procedures: none  Discharge Condition: good  Disposition: Discharge disposition: 01-Home or Self Care       Diet: Regular diet  Discharge Activity: No lifting, driving, or strenuous exercise for 2 weeks   Allergies as of 04/19/2023   No Known Allergies      Medication List     STOP taking these medications    cephALEXin 500 MG capsule Commonly known as: KEFLEX       TAKE these medications    ACCRUFeR 30 MG Caps Generic drug: Ferric Maltol Take 1 capsule (30 mg total) by mouth 2 (two) times daily. Take 1h before or 2h after meals.   multivitamin-prenatal 27-0.8 MG Tabs tablet Take 1 tablet by mouth daily at 12 noon.   omeprazole 10 MG capsule Commonly known as: PRILOSEC Take 1 capsule (10 mg total) by mouth daily. May increase to 20 mg daily after 2-4 weeks        Follow-up Information     Glenetta Borg, CNM. Schedule an appointment as soon as possible for a visit.   Specialty: Obstetrics Why: Video visit in 2 weeks PP office visit in 6 weeks Contact information: [address] [place] [address] [phone]                 Total time spent taking care of this patient: 10 minutes  Signed: Doreene Burke 04/19/2023, 10:01 AM

## 2023-04-19 NOTE — Progress Notes (Signed)
Patient discharged home with infant. Discharge instructions and prescriptions given and reviewed with patient. Patient verbalized understanding. Escorted out by auxillary.  

## 2023-04-19 NOTE — Discharge Instructions (Signed)

## 2023-04-20 ENCOUNTER — Encounter: Payer: BLUE CROSS/BLUE SHIELD | Admitting: Obstetrics and Gynecology

## 2023-04-20 DIAGNOSIS — Z3480 Encounter for supervision of other normal pregnancy, unspecified trimester: Secondary | ICD-10-CM

## 2023-04-20 DIAGNOSIS — Z3A38 38 weeks gestation of pregnancy: Secondary | ICD-10-CM

## 2023-04-23 ENCOUNTER — Encounter: Payer: BLUE CROSS/BLUE SHIELD | Admitting: Certified Nurse Midwife

## 2023-04-29 ENCOUNTER — Inpatient Hospital Stay: Admit: 2023-04-29 | Payer: Self-pay

## 2023-05-04 ENCOUNTER — Telehealth: Payer: BLUE CROSS/BLUE SHIELD | Admitting: Obstetrics

## 2023-05-05 ENCOUNTER — Encounter: Payer: Self-pay | Admitting: Certified Nurse Midwife

## 2023-05-05 ENCOUNTER — Telehealth (INDEPENDENT_AMBULATORY_CARE_PROVIDER_SITE_OTHER): Payer: BLUE CROSS/BLUE SHIELD | Admitting: Certified Nurse Midwife

## 2023-05-05 DIAGNOSIS — Z1332 Encounter for screening for maternal depression: Secondary | ICD-10-CM | POA: Diagnosis not present

## 2023-05-05 NOTE — Progress Notes (Signed)
   Virtual Visit via Video Note  I connected with Madison Nichols on 05/05/23 at   2:55 PM EST by a video enabled telemedicine application and verified that I am speaking with the correct person using two identifiers.  Location: Patient: Home Provider: [facility] office   I discussed the limitations of evaluation and management by telemedicine and the availability of in person appointments. The patient expressed understanding and agreed to proceed.    History of Present Illness:   Madison Nichols is a 22 y.o. G54P3003 female who presents for a 2 week televisit for mood check. She is 2 weeks postpartum following a spontaneous vaginal.  The delivery was at 38 gestational weeks.  Postpartum course has been well so far. Baby is feeding by bottle, formula. Bleeding: light, without clots or odor. Postpartum depression screening: negative.  EDPS score is 0.      The following portions of the patient's history were reviewed and updated as appropriate: allergies, current medications, past family history, past medical history, past social history, past surgical history, and problem list.   Observations/Objective:   not currently breastfeeding. Gen App: NAD Psych: normal speech, affect. Good mood.        05/05/2023    2:54 PM 04/19/2023    9:12 AM 09/28/2022    2:29 PM 08/11/2020    7:25 PM 08/11/2020    6:06 PM  Edinburgh Postnatal Depression Scale Screening Tool  I have been able to laugh and see the funny side of things. 0 0 0 0 --  I have looked forward with enjoyment to things. 0 0 0 0   I have blamed myself unnecessarily when things went wrong. 0 0 0 1   I have been anxious or worried for no good reason. 0 1 0 0   I have felt scared or panicky for no good reason. 0 0 0 0   Things have been getting on top of me. 0 0 0 0   I have been so unhappy that I have had difficulty sleeping. 0 0 0 0   I have felt sad or miserable. 0 0 0 0   I have been so unhappy that I have been crying. 0 0 0 0   The  thought of harming myself has occurred to me. 0 0 0 0   Edinburgh Postnatal Depression Scale Total 0 1 0 1          Assessment and Plan:   1. Encounter for screening for maternal depression - Screening Negative today. Will rescreen at 6 week postpartum visit.     2. Postpartum state - Overall doing well. Continue routine postpartum home care.    3. Contraception - considering Paragard, recommend no IC until placed. Placement process, r/b reviewed. Information sent via MyChart   Follow Up Instructions:     I discussed the assessment and treatment plan with the patient. The patient was provided an opportunity to ask questions and all were answered. The patient agreed with the plan and demonstrated an understanding of the instructions.   The patient was advised to call back or seek an in-person evaluation if the symptoms worsen or if the condition fails to improve as anticipated.   Dominica Severin, CNM

## 2023-05-31 ENCOUNTER — Ambulatory Visit (INDEPENDENT_AMBULATORY_CARE_PROVIDER_SITE_OTHER): Payer: BLUE CROSS/BLUE SHIELD | Admitting: Certified Nurse Midwife

## 2023-05-31 DIAGNOSIS — Z30430 Encounter for insertion of intrauterine contraceptive device: Secondary | ICD-10-CM

## 2023-05-31 DIAGNOSIS — Z1332 Encounter for screening for maternal depression: Secondary | ICD-10-CM | POA: Diagnosis not present

## 2023-05-31 DIAGNOSIS — D649 Anemia, unspecified: Secondary | ICD-10-CM

## 2023-05-31 DIAGNOSIS — Z3009 Encounter for other general counseling and advice on contraception: Secondary | ICD-10-CM

## 2023-05-31 MED ORDER — PARAGARD INTRAUTERINE COPPER IU IUD: 1.0000 | INTRAUTERINE_SYSTEM | Freq: Once | INTRAUTERINE | Status: DC

## 2023-06-01 ENCOUNTER — Encounter: Payer: Self-pay | Admitting: Certified Nurse Midwife

## 2023-06-01 ENCOUNTER — Ambulatory Visit: Payer: BLUE CROSS/BLUE SHIELD | Admitting: Obstetrics

## 2023-06-01 LAB — CBC
Hematocrit: 37.4 % (ref 34.0–46.6)
Hemoglobin: 12.1 g/dL (ref 11.1–15.9)
MCH: 30 pg (ref 26.6–33.0)
MCHC: 32.4 g/dL (ref 31.5–35.7)
MCV: 93 fL (ref 79–97)
Platelets: 177 10*3/uL (ref 150–450)
RBC: 4.04 x10E6/uL (ref 3.77–5.28)
RDW: 15.5 % — ABNORMAL HIGH (ref 11.7–15.4)
WBC: 4.3 10*3/uL (ref 3.4–10.8)

## 2023-06-01 MED ORDER — DROSPIRENONE-ETHINYL ESTRADIOL 3-0.02 MG PO TABS: 1.0000 | ORAL_TABLET | Freq: Every day | ORAL | 4 refills | Status: DC

## 2023-06-16 ENCOUNTER — Encounter: Payer: Self-pay | Admitting: Oncology

## 2023-06-16 ENCOUNTER — Ambulatory Visit: Payer: Medicaid Other

## 2023-08-12 ENCOUNTER — Other Ambulatory Visit: Payer: Self-pay

## 2023-08-12 MED ORDER — DROSPIRENONE-ETHINYL ESTRADIOL 3-0.02 MG PO TABS: 1.0000 | ORAL_TABLET | Freq: Every day | ORAL | 6 refills | Status: AC

## 2023-09-03 ENCOUNTER — Ambulatory Visit

## 2023-09-09 ENCOUNTER — Telehealth: Payer: Self-pay

## 2023-09-09 NOTE — Telephone Encounter (Signed)
 Contact the patient via phone. Left message asking the patient to contact our office for scheduling. Was going to offer opening on 09/23/23 at 10:55 am with Shanda Bumps A.

## 2023-09-10 NOTE — Telephone Encounter (Signed)
 Contact the patient via phone. Left message asking the patient to contact our office for scheduling.
